# Patient Record
Sex: Male | Born: 1955 | Race: White | Hispanic: No | Marital: Married | Smoking: Current some day smoker
Health system: Southern US, Community
[De-identification: ages and names within clinical notes are randomized; demographics above are authoritative.]

## PROBLEM LIST (undated history)

## (undated) DIAGNOSIS — Z8619 Personal history of other infectious and parasitic diseases: Secondary | ICD-10-CM

## (undated) DIAGNOSIS — M1990 Unspecified osteoarthritis, unspecified site: Secondary | ICD-10-CM

## (undated) DIAGNOSIS — I10 Essential (primary) hypertension: Secondary | ICD-10-CM

## (undated) DIAGNOSIS — N400 Enlarged prostate without lower urinary tract symptoms: Secondary | ICD-10-CM

## (undated) HISTORY — DX: Personal history of other infectious and parasitic diseases: Z86.19

## (undated) HISTORY — PX: POLYPECTOMY: SHX149

## (undated) HISTORY — DX: Essential (primary) hypertension: I10

## (undated) HISTORY — PX: OTHER SURGICAL HISTORY: SHX169

## (undated) HISTORY — PX: COLONOSCOPY: SHX174

## (undated) HISTORY — DX: Unspecified osteoarthritis, unspecified site: M19.90

## (undated) HISTORY — DX: Benign prostatic hyperplasia without lower urinary tract symptoms: N40.0

---

## 2012-10-12 ENCOUNTER — Ambulatory Visit (INDEPENDENT_AMBULATORY_CARE_PROVIDER_SITE_OTHER): Payer: Managed Care, Other (non HMO) | Admitting: Family

## 2012-10-12 ENCOUNTER — Encounter: Payer: Self-pay | Admitting: Gastroenterology

## 2012-10-12 ENCOUNTER — Encounter: Payer: Self-pay | Admitting: Family

## 2012-10-12 VITALS — BP 140/90 | HR 60 | Temp 98.4°F | Resp 16 | Ht 79.5 in | Wt 204.1 lb

## 2012-10-12 DIAGNOSIS — Z136 Encounter for screening for cardiovascular disorders: Secondary | ICD-10-CM

## 2012-10-12 DIAGNOSIS — R195 Other fecal abnormalities: Secondary | ICD-10-CM

## 2012-10-12 DIAGNOSIS — Z0000 Encounter for general adult medical examination without abnormal findings: Secondary | ICD-10-CM

## 2012-10-12 DIAGNOSIS — B1920 Unspecified viral hepatitis C without hepatic coma: Secondary | ICD-10-CM

## 2012-10-12 DIAGNOSIS — Z8619 Personal history of other infectious and parasitic diseases: Secondary | ICD-10-CM | POA: Insufficient documentation

## 2012-10-12 DIAGNOSIS — N400 Enlarged prostate without lower urinary tract symptoms: Secondary | ICD-10-CM

## 2012-10-12 DIAGNOSIS — R350 Frequency of micturition: Secondary | ICD-10-CM

## 2012-10-12 DIAGNOSIS — J0100 Acute maxillary sinusitis, unspecified: Secondary | ICD-10-CM

## 2012-10-12 DIAGNOSIS — Z23 Encounter for immunization: Secondary | ICD-10-CM

## 2012-10-12 DIAGNOSIS — Z86010 Personal history of colonic polyps: Secondary | ICD-10-CM | POA: Insufficient documentation

## 2012-10-12 HISTORY — DX: Benign prostatic hyperplasia without lower urinary tract symptoms: N40.0

## 2012-10-12 LAB — BASIC METABOLIC PANEL WITH GFR
Calcium: 9 mg/dL (ref 8.4–10.5)
Creat: 0.83 mg/dL (ref 0.50–1.35)
GFR, Est African American: >89 mL/min
GFR, Est Non African American: >89 mL/min

## 2012-10-12 LAB — CBC WITH DIFFERENTIAL/PLATELET
Basophils Absolute: 0 10*3/uL (ref 0.0–0.1)
Basophils Relative: 1 % (ref 0–1)
Eosinophils Absolute: 0.1 10*3/uL (ref 0.0–0.7)
Eosinophils Relative: 1 % (ref 0–5)
Lymphocytes Relative: 21 % (ref 12–46)
MCHC: 34.9 g/dL (ref 30.0–36.0)
MCV: 99.4 fL (ref 78.0–100.0)
Platelets: 177 10*3/uL (ref 150–400)
RDW: 12.9 % (ref 11.5–15.5)
WBC: 7.9 10*3/uL (ref 4.0–10.5)

## 2012-10-12 LAB — HEPATIC FUNCTION PANEL
Albumin: 4.7 g/dL (ref 3.5–5.2)
Total Bilirubin: 0.6 mg/dL (ref 0.3–1.2)

## 2012-10-12 LAB — LIPID PANEL
Cholesterol: 174 mg/dL (ref 0–200)
HDL: 35 mg/dL — ABNORMAL LOW (ref >39)
Total CHOL/HDL Ratio: 5 Ratio

## 2012-10-12 MED ORDER — AMOXICILLIN-POT CLAVULANATE 875-125 MG PO TABS: 1.0000 | ORAL_TABLET | Freq: Two times a day (BID) | ORAL | Status: DC

## 2012-10-12 NOTE — Addendum Note (Signed)
Addended by: Mervin Kung A on: 10/12/2012 11:47 AM   Modules accepted: Orders

## 2012-10-12 NOTE — Assessment & Plan Note (Signed)
Will rx with Augmentin.

## 2012-10-12 NOTE — Progress Notes (Signed)
Subjective:    Patient ID: William Neal, male    DOB: [birth year], 57 y.o.   MRN: [id number]  HPI   Nasal congestion- on and off all winter. Worse in the afternoon, sinuses swell up.  Nasal discharge is yellow in the mornings and is associated with maxillary sinus pressure.  He denies associated fever.  Hepatitis C- diagnosed in 1990's and was treated.  Reports that this was "cleared."  Tobacco abuse-  Not motivated to quit.    Patient presents today for complete physical.  Immunizations: due for tetanus, declines flu shot Diet: reports diet is "meat and potatos" raw veggies. 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  Colonoscopy: never had one  Review of Systems  Constitutional: Negative for unexpected weight change.  HENT: Negative for hearing loss.   Eyes: Negative for visual disturbance.  Respiratory: Negative for shortness of breath.   Cardiovascular: Negative for chest pain.  Gastrointestinal: Negative for vomiting and diarrhea.       Diarrhea with onions  Genitourinary: Positive for frequency. Negative for dysuria.       Nocturia 1-2x at night.  Musculoskeletal: Negative for myalgias and arthralgias.  Skin: Negative for rash.  Neurological:       Reports + sinus HA  Hematological: Negative for adenopathy.  Psychiatric/Behavioral:       Denies depression/anxiety   Past Medical History  Diagnosis Date  . History of hepatitis C     History   Social History  . Marital Status: Married    Spouse Name: N/A    Number of Children: N/A  . Years of Education: N/A   Occupational History  . Not on file.   Social History Main Topics  . Smoking status: Current Every Day Smoker -- 1.00 packs/day for 35 years    Types: Cigarettes  . Smokeless tobacco: Never Used  . Alcohol Use: 12.6 oz/week    21 Shots of liquor per week  . Drug Use: Not on file  . Sexually Active: Not on file   Other Topics Concern  . 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, recycle program   Married   2 step children   Enjoys fishing   Completed college    History reviewed. No pertinent past surgical history.  Family History  Problem Relation Age of Onset  . Diabetes Mother   . Emphysema Father   . Hypertension Maternal Uncle     No Known Allergies  No current outpatient prescriptions on file prior to visit.   No current facility-administered medications on file prior to visit.    BP 140/90  Pulse 60  Temp(Src) 98.4 F (36.9 C) (Oral)  Resp 16  Ht 6' 7.5" (2.019 m)  Wt 204 lb 1.3 oz (92.57 kg)  BMI 22.71 kg/m2  SpO2 97%       Objective:   Physical Exam Physical Exam  Constitutional: He is oriented to person, place, and time. He appears well-developed and well-nourished. No distress.  HENT:  Head: Normocephalic and atraumatic.  Right Ear: Tympanic membrane and ear canal normal.  Left Ear: Tympanic membrane and ear canal normal.  Mouth/Throat: Oropharynx is clear and moist.  Eyes: Pupils are equal, round, and reactive to light. No scleral icterus.  Neck: Normal range of motion. No thyromegaly present.  Cardiovascular: Normal rate and regular rhythm.   No  murmur heard. Pulmonary/Chest: Effort normal and breath sounds normal. No respiratory distress. He has no wheezes. He has no rales. He exhibits no tenderness.  Abdominal: Soft. Bowel sounds are normal. He exhibits no distension and no mass. There is no tenderness. There is no rebound and no guarding.  Musculoskeletal: He exhibits no edema.  Lymphadenopathy:    He has no cervical adenopathy.  Neurological: He is alert and oriented to person, place, and time. He has normal reflexes. He exhibits normal muscle tone. Coordination normal.  Skin: Skin is warm and dry.  Psychiatric: He has a normal mood and affect. His behavior is normal. Judgment and thought content normal.  GU: Prostate is 1+  enlarged, smooth without nodules.  Heme+ stool         Assessment & Plan:          Assessment & Plan:

## 2012-10-12 NOTE — Assessment & Plan Note (Signed)
Pt counseled on healthy diet, exercise. Recommended that he limit his ETOH to <2 drinks a day. Refer for colo, obtain fasting lab work.  Pt counseled on smoking cessation, Tdap today.

## 2012-10-12 NOTE — Assessment & Plan Note (Signed)
S/p treatment. Will obtain lft and viral load.

## 2012-10-12 NOTE — Patient Instructions (Addendum)
Please complete your lab work prior to leaving.  You will be contacted about your referral for colonoscopy.  Please let us know if you have not heard back within 1 week about your referral. Please work hard on quitting smoking. Schedule mole removal at the front desk. Welcome to [facility]!

## 2012-10-12 NOTE — Assessment & Plan Note (Signed)
Will check urine to rule out infection or glucosuria.  If normal, consider trial of flomax.

## 2012-10-12 NOTE — Assessment & Plan Note (Signed)
Pt noted to have heme + stool today on exam.  Never had colo. Will refer to GI for further evaluation.

## 2012-10-13 LAB — URINALYSIS, ROUTINE W REFLEX MICROSCOPIC
Glucose, UA: NEGATIVE mg/dL
Hgb urine dipstick: NEGATIVE
Ketones, ur: NEGATIVE mg/dL
Leukocytes, UA: NEGATIVE
Specific Gravity, Urine: 1.011 (ref 1.005–1.030)
pH: 6 (ref 5.0–8.0)

## 2012-10-15 ENCOUNTER — Encounter: Payer: Self-pay | Admitting: Family

## 2012-10-15 LAB — HEPATITIS C RNA QUANTITATIVE: HCV Quantitative: NOT DETECTED IU/mL (ref <15)

## 2012-11-02 ENCOUNTER — Ambulatory Visit (AMBULATORY_SURGERY_CENTER): Payer: 59 | Admitting: *Deleted

## 2012-11-02 ENCOUNTER — Ambulatory Visit: Payer: Managed Care, Other (non HMO) | Admitting: Family

## 2012-11-02 VITALS — Ht 71.0 in | Wt 208.8 lb

## 2012-11-02 DIAGNOSIS — K921 Melena: Secondary | ICD-10-CM

## 2012-11-02 MED ORDER — NA SULFATE-K SULFATE-MG SULF 17.5-3.13-1.6 GM/177ML PO SOLN: ORAL | Status: DC

## 2012-11-09 ENCOUNTER — Encounter: Payer: Self-pay | Admitting: Gastroenterology

## 2012-11-15 ENCOUNTER — Ambulatory Visit (AMBULATORY_SURGERY_CENTER): Payer: Managed Care, Other (non HMO) | Admitting: Gastroenterology

## 2012-11-15 ENCOUNTER — Encounter: Payer: Self-pay | Admitting: Gastroenterology

## 2012-11-15 VITALS — BP 112/67 | HR 47 | Temp 98.2°F | Resp 17 | Ht 71.0 in | Wt 208.0 lb

## 2012-11-15 DIAGNOSIS — D126 Benign neoplasm of colon, unspecified: Secondary | ICD-10-CM

## 2012-11-15 DIAGNOSIS — K921 Melena: Secondary | ICD-10-CM

## 2012-11-15 DIAGNOSIS — Z1211 Encounter for screening for malignant neoplasm of colon: Secondary | ICD-10-CM

## 2012-11-15 MED ORDER — SODIUM CHLORIDE 0.9 % IV SOLN: 500.0000 mL | INTRAVENOUS | Status: DC

## 2012-11-15 NOTE — Patient Instructions (Addendum)
One of your biggest health concerns is your smoking.  This increases your risk for most cancers and serious cardiovascular diseases such as strokes, heart attacks.  You should try your best to stop.  If you need assistance, please contact your PCP or Smoking Cessation Class at [facility] [phone]) or [facility] Quit-Line (1-800-QUIT-NOW). Discharge instructions given with verbal understanding. Handout on polyps given. Resume previous medications. YOU HAD AN ENDOSCOPIC PROCEDURE TODAY AT THE [facility]: Refer to the procedure report that was given to you for any specific questions about what was found during the examination.  If the procedure report does not answer your questions, please call your gastroenterologist to clarify.  If you requested that your care partner not be given the details of your procedure findings, then the procedure report has been included in a sealed envelope for you to review at your convenience later.  YOU SHOULD EXPECT: Some feelings of bloating in the abdomen. Passage of more gas than usual.  Walking can help get rid of the air that was put into your GI tract during the procedure and reduce the bloating. If you had a lower endoscopy (such as a colonoscopy or flexible sigmoidoscopy) you may notice spotting of blood in your stool or on the toilet paper. If you underwent a bowel prep for your procedure, then you may not have a normal bowel movement for a few days.  DIET: Your first meal following the procedure should be a light meal and then it is ok to progress to your normal diet.  A half-sandwich or bowl of soup is an example of a good first meal.  Heavy or fried foods are harder to digest and may make you feel nauseous or bloated.  Likewise meals heavy in dairy and vegetables can cause extra gas to form and this can also increase the bloating.  Drink plenty of fluids but you should avoid alcoholic beverages for 24 hours.  ACTIVITY: Your care partner  should take you home directly after the procedure.  You should plan to take it easy, moving slowly for the rest of the day.  You can resume normal activity the day after the procedure however you should NOT DRIVE or use heavy machinery for 24 hours (because of the sedation medicines used during the test).    SYMPTOMS TO REPORT IMMEDIATELY: A gastroenterologist can be reached at any hour.  During normal business hours, 8:30 AM to 5:00 PM Monday through Friday, call [phone].  After hours and on weekends, please call the GI answering service at [phone] who will take a message and have the physician on call contact you.   Following lower endoscopy (colonoscopy or flexible sigmoidoscopy):  Excessive amounts of blood in the stool  Significant tenderness or worsening of abdominal pains  Swelling of the abdomen that is new, acute  Fever of 100F or higher  FOLLOW UP: If any biopsies were taken you will be contacted by phone or by letter within the next 1-3 weeks.  Call your gastroenterologist if you have not heard about the biopsies in 3 weeks.  Our staff will call the home number listed on your records the next business day following your procedure to check on you and address any questions or concerns that you may have at that time regarding the information given to you following your procedure. This is a courtesy call and so if there is no answer at the home number and we have not heard from you through the  emergency physician on call, we will assume that you have returned to your regular daily activities without incident.  SIGNATURES/CONFIDENTIALITY: You and/or your care partner have signed paperwork which will be entered into your electronic medical record.  These signatures attest to the fact that that the information above on your After Visit Summary has been reviewed and is understood.  Full responsibility of the confidentiality of this discharge information lies with you and/or your  care-partner.

## 2012-11-15 NOTE — Progress Notes (Signed)
Patient did not experience any of the following events: a burn prior to discharge; a fall within the facility; wrong site/side/patient/procedure/implant event; or a hospital transfer or hospital admission upon discharge from the facility. (G8907) Patient did not have preoperative order for IV antibiotic SSI prophylaxis. (G8918)  

## 2012-11-15 NOTE — Op Note (Addendum)
 [facility] [address] [place], [address]   COLONOSCOPY PROCEDURE REPORT  PATIENT: William, Neal  MR#: [id number] BIRTHDATE: 02/27/1956 , 56  yrs. old GENDER: Male ENDOSCOPIST: Rachael Fee, MD REFERRED GN:[id number] Peggyann Juba, FNP PROCEDURE DATE:  11/15/2012 PROCEDURE:   Submucosal injection, any substance and Colonoscopy with snare polypectomy ASA CLASS:   Class II INDICATIONS:average risk screening. MEDICATIONS: Fentanyl 75 mcg IV, Versed 8 mg IV, and These medications were titrated to patient response per physician's verbal order  DESCRIPTION OF PROCEDURE:   After the risks benefits and alternatives of the procedure were thoroughly explained, informed consent was obtained.  A digital rectal exam revealed no abnormalities of the rectum.   The LB [id number]-[id number]  endoscope was introduced through the anus and advanced to the cecum, which was identified by both the appendix and ileocecal valve. No adverse events experienced.   The quality of the prep was good.  The instrument was then slowly withdrawn as the colon was fully examined.  COLON FINDINGS: Three polyps were found, removed, and all were sent to pathology.  One was at splenic flexure, sessile, heaped up and a bit friable.  This was 2cm across and it was removed in piecemeal fashion with snare/cautery (jar 1).  The site was then labeled with [place] Ink.  One polyp was 5mm across, sessile, descending segment, removed with cold snare (jar 2).  The last was pedunculated, 1.9cm across, removed with snare, cautery, sent to pathology (jar 3). The examination was otherwise normal.  Retroflexed views revealed no abnormalities. The time to cecum=1 minutes 28 seconds. Withdrawal time=21 minutes 02 seconds.  The scope was withdrawn and the procedure completed. COMPLICATIONS: There were no complications.  ENDOSCOPIC IMPRESSION: Three polyps were found, removed, and all were sent to pathology. Two were  >1cm. The examination was otherwise normal.  RECOMMENDATIONS: If the polyp(s) removed today are proven to be adenomatous (pre-cancerous) polyps, you will need a colonoscopy in 6 months given the piecemeal resection.  Otherwise you should continue to follow colorectal cancer screening guidelines for "routine risk" patients with a colonoscopy in 10 years.  You will receive a letter within 1-2 weeks with the results of your biopsy as well as final recommendations.  Please call my office if you have not received a letter after 3 weeks.   eSigned:  Rachael Fee, MD 11/15/2012 12:15 PM Revised: 11/15/2012 12:15 PM    PATIENT NAME:  William, Neal MR#: [id number]

## 2012-11-16 ENCOUNTER — Encounter: Payer: Self-pay | Admitting: Family

## 2012-11-16 ENCOUNTER — Ambulatory Visit (INDEPENDENT_AMBULATORY_CARE_PROVIDER_SITE_OTHER): Payer: Managed Care, Other (non HMO) | Admitting: Family

## 2012-11-16 ENCOUNTER — Telehealth: Payer: Self-pay | Admitting: *Deleted

## 2012-11-16 VITALS — BP 122/90 | HR 62 | Temp 98.0°F | Resp 16 | Ht 71.0 in | Wt 209.0 lb

## 2012-11-16 DIAGNOSIS — L989 Disorder of the skin and subcutaneous tissue, unspecified: Secondary | ICD-10-CM

## 2012-11-16 NOTE — Progress Notes (Signed)
  Subjective:    Patient ID: William Neal, male    DOB: [birth year], 57 y.o.   MRN: [id number]  HPI  William Neal is a 57 yr old male who presents today requesting removal of nevus from back and removal of skin tags from neck.    Review of Systems See HPI      Objective:   Physical Exam  Constitutional: He appears well-developed and well-nourished. No distress.  Cardiovascular: Normal rate.   Skin:  Multiple skin tags noted along neck line.   Keratosis noted lower abdomen Small, flat dry lesion noted on right side of scalp.          Assessment & Plan:

## 2012-11-16 NOTE — Telephone Encounter (Signed)
Left message

## 2012-11-16 NOTE — Patient Instructions (Addendum)
Please keep area clean and dry for 24 hours. Call you develop redness/drainage from the treatment areas.

## 2012-11-16 NOTE — Addendum Note (Signed)
Addended by: Mervin Kung A on: 11/16/2012 04:00 PM   Modules accepted: Orders

## 2012-11-16 NOTE — Assessment & Plan Note (Signed)
Procedure including risks/benefits explained to patient.  Questions were answered. After informed consent was obtained skin lesion on the right scalp was cleansed with betadine and then alcohol. 1% Lidocaine with epinephrine was injected under lesion and then shave biopsy was performed. Area was cauterized to obtain hemostasis.  Pt tolerated procedure well.  Specimen sent for pathology review.  Pt instructed to keep the area dry for 24 hours and to contact us if he develops redness, drainage or swelling at the site.  Pt may use tylenol as needed for discomfort today.   Also, multiple neck skin tags were frozen using liquid nitrogen.

## 2012-11-19 ENCOUNTER — Encounter: Payer: Self-pay | Admitting: Gastroenterology

## 2012-11-19 ENCOUNTER — Encounter: Payer: Self-pay | Admitting: Family

## 2013-03-18 ENCOUNTER — Encounter: Payer: Self-pay | Admitting: Gastroenterology

## 2014-04-12 ENCOUNTER — Other Ambulatory Visit: Payer: Self-pay | Admitting: Family

## 2014-04-12 ENCOUNTER — Encounter: Payer: Self-pay | Admitting: Family

## 2014-04-12 ENCOUNTER — Ambulatory Visit (INDEPENDENT_AMBULATORY_CARE_PROVIDER_SITE_OTHER): Payer: Managed Care, Other (non HMO) | Admitting: Family

## 2014-04-12 VITALS — BP 136/88 | HR 62 | Temp 98.8°F | Resp 16 | Ht 71.0 in | Wt 202.1 lb

## 2014-04-12 DIAGNOSIS — R509 Fever, unspecified: Secondary | ICD-10-CM

## 2014-04-12 LAB — CBC WITH DIFFERENTIAL/PLATELET
BASOS ABS: 0 10*3/uL (ref 0.0–0.1)
Basophils Relative: 0 % (ref 0–1)
EOS ABS: 0 10*3/uL (ref 0.0–0.7)
Eosinophils Relative: 0 % (ref 0–5)
HCT: 46.4 % (ref 39.0–52.0)
Hemoglobin: 16.2 g/dL (ref 13.0–17.0)
Lymphocytes Relative: 12 % (ref 12–46)
Lymphs Abs: 1.4 10*3/uL (ref 0.7–4.0)
MCH: 33.4 pg (ref 26.0–34.0)
MCHC: 34.9 g/dL (ref 30.0–36.0)
MCV: 95.7 fL (ref 78.0–100.0)
Monocytes Absolute: 1.5 10*3/uL — ABNORMAL HIGH (ref 0.1–1.0)
Monocytes Relative: 13 % — ABNORMAL HIGH (ref 3–12)
NEUTROS ABS: 8.8 10*3/uL — AB (ref 1.7–7.7)
NEUTROS PCT: 75 % (ref 43–77)
Platelets: 189 10*3/uL (ref 150–400)
RBC: 4.85 MIL/uL (ref 4.22–5.81)
RDW: 13.7 % (ref 11.5–15.5)
WBC: 11.7 10*3/uL — ABNORMAL HIGH (ref 4.0–10.5)

## 2014-04-12 LAB — HEPATIC FUNCTION PANEL
ALBUMIN: 4.4 g/dL (ref 3.5–5.2)
ALT: 14 U/L (ref 0–53)
AST: 18 U/L (ref 0–37)
Alkaline Phosphatase: 80 U/L (ref 39–117)
BILIRUBIN DIRECT: 0.2 mg/dL (ref 0.0–0.3)
Indirect Bilirubin: 0.6 mg/dL (ref 0.2–1.2)
Total Bilirubin: 0.8 mg/dL (ref 0.2–1.2)
Total Protein: 6.9 g/dL (ref 6.0–8.3)

## 2014-04-12 MED ORDER — DOXYCYCLINE HYCLATE 100 MG PO TABS: 100.0000 mg | ORAL_TABLET | Freq: Two times a day (BID) | ORAL | Status: DC

## 2014-04-12 NOTE — Progress Notes (Signed)
Pre visit review using our clinic review tool, if applicable. No additional management support is needed unless otherwise documented below in the visit note. 

## 2014-04-12 NOTE — Assessment & Plan Note (Signed)
Cause of fever is most likely viral or possibly due to lyme disease.  He does report tick bite 10 days ago.  Will obtain baseline labs, RMSF titer, lyme testing, start empiric doxy.  HA is concerning, however he does not have nuchal rigidity so my suspiscion for meningitis is low.  His visual complaints are consistent with his report of previous optical migraines. He denies current blurring at time of exam.  I did advise pt that should he develop visual changes which do not improve in a few minutes, he should proceed to the ED and he verbalizes understanding.

## 2014-04-12 NOTE — Patient Instructions (Addendum)
Please complete lab work prior to leaving. We will contact you with your results. Start doxycycline for possible lyme disease. Follow up on 04/24/14.  Call sooner if symptoms worsen, or if you are not feeling better in 2-3 days.

## 2014-04-12 NOTE — Progress Notes (Signed)
Subjective:    Patient ID: William Neal, male    DOB: 1956-07-25, 58 y.o.   MRN: [id number]  HPI  William Neal is a 58 yr old male who presents today.  1) Fever- Monday after lunch developed aches/chills.  Tmax 101.3 that night.  Took advil went to bed.  Has had HA "this whole time." Last night took temp 101.8, took advil, woke up "in a puddle of sweat."  Mild nausea without vomiting.  + diarrhea x 3.  Denies neck pain/stiffness.  Mild photophobia.  Reports hx of migraines.  Reports HA is 6/10.  Reports wife had vomiting 1 week ago and this resolved.  Denies recent travel. Reports + tick bite on his back 10 days ago.    2) Blurred vision- reports episode of blurred vision in the left eye x 2 days. Reports that Monday and today he has had some intermittent blurring in the left eye.  In the past he has had vision changes with migraines.  No blurring right now.      Review of Systems  Respiratory:       Mild cough, feels like it is "harder to get air in and out."   Gastrointestinal: Negative for abdominal pain.  Genitourinary: Positive for frequency. Negative for dysuria.  Hematological: Negative for adenopathy.   Past Medical History  Diagnosis Date  . History of hepatitis C     History   Social History  . Marital Status: Married    Spouse Name: N/A    Number of Children: N/A  . Years of Education: N/A   Occupational History  . Not on file.   Social History Main Topics  . Smoking status: Current Every Day Smoker -- 1.00 packs/day for 35 years    Types: Cigarettes  . Smokeless tobacco: Never Used  . Alcohol Use: 12.6 oz/week    21 Shots of liquor per week  . Drug Use: No  . Sexual Activity: Not on file   Other Topics Concern  . 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 No prior surgery      Family History  Problem Relation Age of Onset  . Diabetes Mother   . Emphysema Father   . Hypertension Maternal Uncle   . Colon cancer Neg Hx   . Esophageal cancer Neg Hx   . Rectal cancer Neg Hx   . Stomach cancer Neg Hx     No Known Allergies  No current outpatient prescriptions on file prior to visit.   No current facility-administered medications on file prior to visit.    BP 136/88  Pulse 62  Temp(Src) 98.8 F (37.1 C) (Oral)  Resp 16  Ht 5\' 11"  (1.803 m)  Wt 202 lb 1.9 oz (91.681 kg)  BMI 28.20 kg/m2  SpO2 99%       Objective:   Physical Exam  Constitutional: He is oriented to person, place, and time. He appears well-developed and well-nourished. No distress.  HENT:  Head: Normocephalic and atraumatic.  Right Ear: Tympanic membrane and ear canal normal.  Left Ear: Tympanic membrane and ear canal normal.  Mouth/Throat: No oropharyngeal exudate, posterior oropharyngeal edema or posterior oropharyngeal erythema.  Eyes: Right eye exhibits no discharge.  No scleral icterus.  Fundoscopic exam:      The right eye shows no hemorrhage.       The left eye shows no hemorrhage.  Cardiovascular: Normal rate and regular rhythm.   No murmur heard. Pulmonary/Chest: Effort normal and breath sounds normal. No respiratory distress. He has no wheezes. He has no rales. He exhibits no tenderness.  Lymphadenopathy:    He has no cervical adenopathy.  Neurological: He is alert and oriented to person, place, and time.  Psychiatric: He has a normal mood and affect. His behavior is normal. Judgment and thought content normal.          Assessment & Plan:

## 2014-04-13 LAB — URINALYSIS W MICROSCOPIC + REFLEX CULTURE
BACTERIA UA: NONE SEEN
BILIRUBIN URINE: NEGATIVE
CASTS: NONE SEEN
CRYSTALS: NONE SEEN
Glucose, UA: NEGATIVE mg/dL
Hgb urine dipstick: NEGATIVE
KETONES UR: NEGATIVE mg/dL
Leukocytes, UA: NEGATIVE
Nitrite: NEGATIVE
PH: 6 (ref 5.0–8.0)
Protein, ur: NEGATIVE mg/dL
SPECIFIC GRAVITY, URINE: 1.018 (ref 1.005–1.030)
Squamous Epithelial / LPF: NONE SEEN
Urobilinogen, UA: 1 mg/dL (ref 0.0–1.0)

## 2014-04-13 LAB — LYME DISEASE DNA BY PCR(BORRELIA BURG): B burgdorferi DNA: NOT DETECTED

## 2014-04-13 LAB — ROCKY MTN SPOTTED FVR ABS PNL(IGG+IGM)
RMSF IgG: 0.1 IV
RMSF IgM: 0.57 IV

## 2014-04-13 LAB — LYME AB/WESTERN BLOT REFLEX: B BURGDORFERI AB IGG+ IGM: 0.16 {ISR}

## 2014-04-14 ENCOUNTER — Telehealth: Payer: Self-pay | Admitting: *Deleted

## 2014-04-14 NOTE — Telephone Encounter (Signed)
Notified pt and he voices understanding. 

## 2014-04-14 NOTE — Telephone Encounter (Signed)
Message copied by Ronny Flurry on Fri Apr 14, 2014  8:59 AM ------      Message from: O'SULLIVAN, MELISSA      Created: Thu Apr 13, 2014  8:57 PM       Labs look good- neg for rocky mountain spotted fever, neg for lyme disease. He can stop doxycycline.  White blood cell count is mildly elevated.  I suspect that his symptoms are viral in nature.  How is he feeling?  He should contact us if symptoms worsen, if fever >101,  Or if symptoms do not continue to improve. ------

## 2014-04-14 NOTE — Telephone Encounter (Signed)
OK to continue doxycycline.

## 2014-04-14 NOTE — Telephone Encounter (Signed)
Notified pt and he voices understanding. He reports that he has felt much better since starting the antibiotic and is hesitant to stop it. He reports that he continues to have night sweats and fatigue but denies fevers > 101.  Please advise.

## 2014-04-24 ENCOUNTER — Encounter: Payer: Self-pay | Admitting: Family

## 2014-04-24 ENCOUNTER — Telehealth: Payer: Self-pay | Admitting: Family

## 2014-04-24 ENCOUNTER — Ambulatory Visit (INDEPENDENT_AMBULATORY_CARE_PROVIDER_SITE_OTHER): Payer: Managed Care, Other (non HMO) | Admitting: Family

## 2014-04-24 VITALS — BP 126/86 | HR 55 | Temp 98.1°F | Resp 16 | Ht 71.0 in | Wt 208.0 lb

## 2014-04-24 DIAGNOSIS — J329 Chronic sinusitis, unspecified: Secondary | ICD-10-CM | POA: Insufficient documentation

## 2014-04-24 DIAGNOSIS — R195 Other fecal abnormalities: Secondary | ICD-10-CM

## 2014-04-24 DIAGNOSIS — L723 Sebaceous cyst: Secondary | ICD-10-CM

## 2014-04-24 DIAGNOSIS — R5381 Other malaise: Secondary | ICD-10-CM

## 2014-04-24 DIAGNOSIS — J0110 Acute frontal sinusitis, unspecified: Secondary | ICD-10-CM

## 2014-04-24 DIAGNOSIS — R5383 Other fatigue: Principal | ICD-10-CM

## 2014-04-24 DIAGNOSIS — L729 Follicular cyst of the skin and subcutaneous tissue, unspecified: Secondary | ICD-10-CM | POA: Insufficient documentation

## 2014-04-24 LAB — CBC WITH DIFFERENTIAL/PLATELET
BASOS PCT: 1 % (ref 0–1)
Basophils Absolute: 0.1 10*3/uL (ref 0.0–0.1)
Eosinophils Absolute: 0.2 10*3/uL (ref 0.0–0.7)
Eosinophils Relative: 3 % (ref 0–5)
HEMATOCRIT: 46.3 % (ref 39.0–52.0)
Hemoglobin: 15.9 g/dL (ref 13.0–17.0)
LYMPHS PCT: 22 % (ref 12–46)
Lymphs Abs: 1.8 10*3/uL (ref 0.7–4.0)
MCH: 33.1 pg (ref 26.0–34.0)
MCHC: 34.3 g/dL (ref 30.0–36.0)
MCV: 96.3 fL (ref 78.0–100.0)
Monocytes Absolute: 1 10*3/uL (ref 0.1–1.0)
Monocytes Relative: 12 % (ref 3–12)
NEUTROS ABS: 5 10*3/uL (ref 1.7–7.7)
NEUTROS PCT: 62 % (ref 43–77)
PLATELETS: 259 10*3/uL (ref 150–400)
RBC: 4.81 MIL/uL (ref 4.22–5.81)
RDW: 13.8 % (ref 11.5–15.5)
WBC: 8 10*3/uL (ref 4.0–10.5)

## 2014-04-24 LAB — TSH: TSH: 2.673 u[IU]/mL (ref 0.350–4.500)

## 2014-04-24 MED ORDER — CEFUROXIME AXETIL 500 MG PO TABS: 500.0000 mg | ORAL_TABLET | Freq: Two times a day (BID) | ORAL | Status: DC

## 2014-04-24 NOTE — Progress Notes (Signed)
Pre visit review using our clinic review tool, if applicable. No additional management support is needed unless otherwise documented below in the visit note. 

## 2014-04-24 NOTE — Progress Notes (Signed)
Subjective:    Patient ID: William Neal, male    DOB: [birth year], 58 y.o.   MRN: [id number]  HPI  William Neal is a 58 yr old male who presents today for follow up. He was initially seen on 04/12/14 with complaint of fever and fatigue.  He was treated empirically with doxycycline- reports that he partially completed the rx.  RMSF/Lyme testing was negative. WBC was noted to be mildly elevated at 11.7.    Today he reports poor energy, ongoing sinus congestion,  + frontal sinus headache, not as severe as before. Nasal drainage is thick and white. Denies associated fever.   Reports vision has returned to normal.  Declines referral for eye exam at this time.   Skin cyst-  Left cheek.   Reports that he first noticed at age 28, since that time it will flare up from time to time and drain. Reports it is currently "flared up."   Review of Systems See HPI  Past Medical History  Diagnosis Date  . History of hepatitis C     History   Social History  . Marital Status: Married    Spouse Name: N/A    Number of Children: N/A  . Years of Education: N/A   Occupational History  . Not on file.   Social History Main Topics  . Smoking status: Current Every Day Smoker -- 1.00 packs/day for 35 years    Types: Cigarettes  . Smokeless tobacco: Never Used  . Alcohol Use: 12.6 oz/week    21 Shots of liquor per week  . Drug Use: No  . Sexual Activity: Not on file   Other Topics Concern  . 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 No prior surgery      Family History  Problem Relation Age of Onset  . Diabetes Mother   . Emphysema Father   . Hypertension Maternal Uncle   . Colon cancer Neg Hx   . Esophageal cancer Neg Hx   . Rectal cancer Neg Hx   . Stomach cancer Neg Hx     No Known  Allergies  No current outpatient prescriptions on file prior to visit.   No current facility-administered medications on file prior to visit.    BP 126/86  Pulse 55  Temp(Src) 98.1 F (36.7 C) (Oral)  Resp 16  Ht 5\' 11"  (1.803 m)  Wt 208 lb 0.6 oz (94.366 kg)  BMI 29.03 kg/m2  SpO2 95%       Objective:   Physical Exam  Constitutional: He appears well-developed and well-nourished. No distress.  HENT:  Head: Normocephalic and atraumatic.  Cardiovascular: Normal rate and regular rhythm.   No murmur heard. Pulmonary/Chest: Effort normal and breath sounds normal. No respiratory distress. He has no wheezes. He has no rales. He exhibits no tenderness.  Abdominal: Soft. Bowel sounds are normal. He exhibits no distension. There is no tenderness. There is no rebound.  Neurological: He is alert.  Skin:  Small pea sized cyst noted beneath skin overlying left cheek bone.  Mild associated erythema and dermal scarring, small open sinus noted.  Psychiatric: He has a normal mood and affect. His behavior is normal. Judgment and thought content normal.  Assessment & Plan:

## 2014-04-24 NOTE — Assessment & Plan Note (Addendum)
Improving but not resolved. Could be related to sinusitis. Will check ebv panel, testosterone and tsh.  Repeat cbc to ensure wbc have returned to normal.

## 2014-04-24 NOTE — Assessment & Plan Note (Signed)
Will rx with ceftin. Add claritin and flonase.

## 2014-04-24 NOTE — Telephone Encounter (Signed)
Relevant patient education assigned to patient using Emmi. ° °

## 2014-04-24 NOTE — Assessment & Plan Note (Signed)
Will rx with ceftin and refer to dermatology, will likely need local excision due to recurrent nature.

## 2014-04-24 NOTE — Telephone Encounter (Signed)
Please contact pt and let him know that I reviewed his chart and see that Dr. Ardis Hughs (GI) wanted to see him back in 6 months follow up from his colonoscopy which was performed in 2014. He should contact his office to arrange follow up. [phone]

## 2014-04-24 NOTE — Patient Instructions (Signed)
Please complete lab work prior to leaving. Start ceftin for sinus and skin cyst on your cheek. Start claritin and flonase (both over the counter). You will be contacted about your referral to dermatology. Call if symptom worsen or if not resolved by the time you complete your antibiotics.

## 2014-04-24 NOTE — Assessment & Plan Note (Addendum)
He is due for follow up with GI, see phone note.

## 2014-04-25 LAB — TESTOSTERONE, FREE, TOTAL, SHBG
SEX HORMONE BINDING: 52 nmol/L (ref 13–71)
TESTOSTERONE FREE: 68.4 pg/mL (ref 47.0–244.0)
TESTOSTERONE: 448 ng/dL (ref 300–890)
Testosterone-% Free: 1.5 % — ABNORMAL LOW (ref 1.6–2.9)

## 2014-04-25 LAB — EPSTEIN-BARR VIRUS VCA ANTIBODY PANEL
EBV EA IGG: 35.5 U/mL — AB (ref <9.0)
EBV NA IgG: 141 U/mL — ABNORMAL HIGH (ref <18.0)
EBV VCA IgG: 305 U/mL — ABNORMAL HIGH (ref <18.0)
EBV VCA IgM: <10 U/mL (ref <36.0)

## 2014-04-25 NOTE — Telephone Encounter (Signed)
Left message for pt to return my call and let us know if we can leave him a detailed message.

## 2014-05-02 NOTE — Telephone Encounter (Signed)
Notified pt and he voices understanding.  States he will see if Dr Ardis Hughs is in network with his insurance and if not will call us back to be referred to [facility].

## 2015-05-09 ENCOUNTER — Ambulatory Visit (INDEPENDENT_AMBULATORY_CARE_PROVIDER_SITE_OTHER): Payer: PRIVATE HEALTH INSURANCE | Admitting: Family

## 2015-05-09 ENCOUNTER — Encounter: Payer: Self-pay | Admitting: Family

## 2015-05-09 VITALS — BP 128/82 | HR 71 | Temp 98.1°F | Resp 16 | Ht 71.0 in | Wt 206.4 lb

## 2015-05-09 DIAGNOSIS — Z720 Tobacco use: Secondary | ICD-10-CM

## 2015-05-09 DIAGNOSIS — J441 Chronic obstructive pulmonary disease with (acute) exacerbation: Secondary | ICD-10-CM

## 2015-05-09 DIAGNOSIS — J209 Acute bronchitis, unspecified: Secondary | ICD-10-CM

## 2015-05-09 DIAGNOSIS — J440 Chronic obstructive pulmonary disease with acute lower respiratory infection: Principal | ICD-10-CM

## 2015-05-09 MED ORDER — ALBUTEROL SULFATE HFA 108 (90 BASE) MCG/ACT IN AERS: 2.0000 | INHALATION_SPRAY | Freq: Four times a day (QID) | RESPIRATORY_TRACT | Status: DC | PRN

## 2015-05-09 MED ORDER — AZITHROMYCIN 250 MG PO TABS: ORAL_TABLET | ORAL | Status: DC

## 2015-05-09 MED ORDER — PREDNISONE 10 MG PO TABS: ORAL_TABLET | ORAL | Status: DC

## 2015-05-09 NOTE — Progress Notes (Signed)
Pre visit review using our clinic review tool, if applicable. No additional management support is needed unless otherwise documented below in the visit note. 

## 2015-05-09 NOTE — Patient Instructions (Signed)
Start zpak and prednisone taper. Also use the albuterol inhaler 2 puffs every 6 hours.  Call if symptoms worsen, if fever, or if not improved in 1 week. Work on quitting smoking.

## 2015-05-09 NOTE — Progress Notes (Signed)
   Subjective:    Patient ID: William Neal, male    DOB: Aug 16, 1956, 59 y.o.   MRN: [id number]  HPI   6 day hx of cough.  Sinus congestion initially now in his chest.  Breathing is "tight" and has some wheezing.  He denies fever. Reports overall malaise, though he continues to work. + sinus headaches.  Tried theraflu last night with little improvement.  He uses flonase and claritin some days.   Review of Systems See HPI  Past Medical History  Diagnosis Date  . History of hepatitis C     Social History   Social History  . Marital Status: Married    Spouse Name: N/A  . Number of Children: N/A  . Years of Education: N/A   Occupational History  . Not on file.   Social History Main Topics  . Smoking status: Current Every Day Smoker -- 1.00 packs/day for 35 years    Types: Cigarettes  . Smokeless tobacco: Never Used  . Alcohol Use: 12.6 oz/week    21 Shots of liquor per week  . Drug Use: No  . Sexual Activity: Not on file   Other Topics Concern  . 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 No prior surgery      Family History  Problem Relation Age of Onset  . Diabetes Mother   . Emphysema Father   . Hypertension Maternal Uncle   . Colon cancer Neg Hx   . Esophageal cancer Neg Hx   . Rectal cancer Neg Hx   . Stomach cancer Neg Hx     No Known Allergies  Current Outpatient Prescriptions on File Prior to Visit  Medication Sig Dispense Refill  . fluticasone (FLONASE) 50 MCG/ACT nasal spray Place 2 sprays into both nostrils daily.    Marland Kitchen loratadine (CLARITIN) 10 MG tablet Take 10 mg by mouth daily.     No current facility-administered medications on file prior to visit.    BP 128/82 mmHg  Pulse 71  Temp(Src) 98.1 F (36.7 C) (Oral)  Resp 16  Ht 5\' 11"  (1.803 m)   Wt 206 lb 6.4 oz (93.622 kg)  BMI 28.80 kg/m2  SpO2 94%       Objective:   Physical Exam  Constitutional: He is oriented to person, place, and time. He appears well-developed and well-nourished. No distress.  HENT:  Head: Normocephalic and atraumatic.  Right Ear: Tympanic membrane and ear canal normal.  Left Ear: Tympanic membrane and ear canal normal.  Mouth/Throat: No oropharyngeal exudate, posterior oropharyngeal edema or posterior oropharyngeal erythema.  Cardiovascular: Normal rate and regular rhythm.   No murmur heard. Pulmonary/Chest: Effort normal. No respiratory distress.  Diminished breath sounds throughout  Musculoskeletal: He exhibits no edema.  Lymphadenopathy:    He has no cervical adenopathy.  Neurological: He is alert and oriented to person, place, and time.  Skin: Skin is warm and dry.  Psychiatric: He has a normal mood and affect. His behavior is normal. Thought content normal.          Assessment & Plan:

## 2015-05-10 DIAGNOSIS — J209 Acute bronchitis, unspecified: Secondary | ICD-10-CM | POA: Insufficient documentation

## 2015-05-10 DIAGNOSIS — J440 Chronic obstructive pulmonary disease with acute lower respiratory infection: Principal | ICD-10-CM

## 2015-05-10 NOTE — Assessment & Plan Note (Signed)
rx with zpak, pred taper, albuterol mdi, counseled pt on the importance of smoking cessation.  3-5 min spent counseling pt on smoking cessation. Pt advised to call if symptoms worsen or if symptoms do not improve.

## 2016-03-10 ENCOUNTER — Encounter: Payer: Self-pay | Admitting: Family

## 2016-03-10 ENCOUNTER — Ambulatory Visit (INDEPENDENT_AMBULATORY_CARE_PROVIDER_SITE_OTHER): Payer: Managed Care, Other (non HMO) | Admitting: Family

## 2016-03-10 VITALS — BP 150/70 | HR 50 | Temp 97.8°F | Ht 71.0 in | Wt 207.8 lb

## 2016-03-10 DIAGNOSIS — F40298 Other specified phobia: Secondary | ICD-10-CM | POA: Diagnosis not present

## 2016-03-10 DIAGNOSIS — Z0001 Encounter for general adult medical examination with abnormal findings: Secondary | ICD-10-CM | POA: Diagnosis not present

## 2016-03-10 DIAGNOSIS — Z0000 Encounter for general adult medical examination without abnormal findings: Secondary | ICD-10-CM | POA: Diagnosis not present

## 2016-03-10 DIAGNOSIS — R350 Frequency of micturition: Secondary | ICD-10-CM

## 2016-03-10 LAB — URINALYSIS, ROUTINE W REFLEX MICROSCOPIC
Bilirubin Urine: NEGATIVE
Hgb urine dipstick: NEGATIVE
KETONES UR: NEGATIVE
LEUKOCYTES UA: NEGATIVE
NITRITE: NEGATIVE
PH: 6 (ref 5.0–8.0)
SPECIFIC GRAVITY, URINE: 1.02 (ref 1.000–1.030)
Total Protein, Urine: NEGATIVE
UROBILINOGEN UA: 0.2 (ref 0.0–1.0)
Urine Glucose: NEGATIVE

## 2016-03-10 LAB — TSH: TSH: 3.02 u[IU]/mL (ref 0.35–4.50)

## 2016-03-10 LAB — LIPID PANEL
CHOLESTEROL: 191 mg/dL (ref 0–200)
HDL: 30.6 mg/dL — AB (ref >39.00)
LDL Cholesterol: 122 mg/dL — ABNORMAL HIGH (ref 0–99)
NonHDL: 160.45
TRIGLYCERIDES: 193 mg/dL — AB (ref 0.0–149.0)
Total CHOL/HDL Ratio: 6
VLDL: 38.6 mg/dL (ref 0.0–40.0)

## 2016-03-10 LAB — PSA: PSA: 0.62 ng/mL (ref 0.10–4.00)

## 2016-03-10 LAB — CBC WITH DIFFERENTIAL/PLATELET
BASOS PCT: 0.5 % (ref 0.0–3.0)
Basophils Absolute: 0 10*3/uL (ref 0.0–0.1)
EOS ABS: 0.2 10*3/uL (ref 0.0–0.7)
EOS PCT: 2.1 % (ref 0.0–5.0)
HEMATOCRIT: 44.1 % (ref 39.0–52.0)
HEMOGLOBIN: 15 g/dL (ref 13.0–17.0)
LYMPHS PCT: 24.9 % (ref 12.0–46.0)
Lymphs Abs: 2.1 10*3/uL (ref 0.7–4.0)
MCHC: 34 g/dL (ref 30.0–36.0)
MCV: 94.3 fl (ref 78.0–100.0)
MONOS PCT: 9.8 % (ref 3.0–12.0)
Monocytes Absolute: 0.8 10*3/uL (ref 0.1–1.0)
Neutro Abs: 5.3 10*3/uL (ref 1.4–7.7)
Neutrophils Relative %: 62.7 % (ref 43.0–77.0)
Platelets: 219 10*3/uL (ref 150.0–400.0)
RBC: 4.68 Mil/uL (ref 4.22–5.81)
RDW: 13.7 % (ref 11.5–15.5)
WBC: 8.4 10*3/uL (ref 4.0–10.5)

## 2016-03-10 LAB — BASIC METABOLIC PANEL
BUN: 17 mg/dL (ref 6–23)
CHLORIDE: 108 meq/L (ref 96–112)
CO2: 27 mEq/L (ref 19–32)
Calcium: 9.4 mg/dL (ref 8.4–10.5)
Creatinine, Ser: 0.9 mg/dL (ref 0.40–1.50)
GFR: 92.24 mL/min (ref >60.00)
Glucose, Bld: 102 mg/dL — ABNORMAL HIGH (ref 70–99)
POTASSIUM: 4 meq/L (ref 3.5–5.1)
SODIUM: 141 meq/L (ref 135–145)

## 2016-03-10 LAB — HEPATIC FUNCTION PANEL
ALBUMIN: 4.4 g/dL (ref 3.5–5.2)
ALT: 18 U/L (ref 0–53)
AST: 19 U/L (ref 0–37)
Alkaline Phosphatase: 91 U/L (ref 39–117)
Bilirubin, Direct: 0.1 mg/dL (ref 0.0–0.3)
Total Bilirubin: 0.4 mg/dL (ref 0.2–1.2)
Total Protein: 6.5 g/dL (ref 6.0–8.3)

## 2016-03-10 MED ORDER — TAMSULOSIN HCL 0.4 MG PO CAPS: 0.4000 mg | ORAL_CAPSULE | Freq: Every day | ORAL | Status: DC

## 2016-03-10 MED ORDER — LORAZEPAM 1 MG PO TABS: ORAL_TABLET | ORAL | Status: DC

## 2016-03-10 MED FILL — LORazepam 1 MG TABS: 1 | 3 days supply | Qty: 3 | Fill #0

## 2016-03-10 MED FILL — TAMSULOSIN HCL 0.4 MG CAP: 0.4 | 30 days supply | Qty: 30 | Fill #0

## 2016-03-10 NOTE — Progress Notes (Signed)
Pre visit review using our clinic review tool, if applicable. No additional management support is needed unless otherwise documented below in the visit note. 

## 2016-03-10 NOTE — Addendum Note (Signed)
Addended by: Debbrah Alar on: 03/10/2016 11:31 AM   Modules accepted: Miquel Dunn

## 2016-03-10 NOTE — Patient Instructions (Addendum)
You will be contacted about your appointment for colonoscopy.  Schedule follow up with your dentist. You may use 1 tablet of ativan prior to dental procedure for anxiety (have someone drive you). Schedule a routine eye exam. Let me know when you are ready to quit smoking. Try to get 30 minutes of cardio 5 days a week. Try to eat more fresh fruits/veggies. Begin flomax for your urinary frequency.

## 2016-03-10 NOTE — Progress Notes (Addendum)
Subjective:    Patient ID: William Neal, male    DOB: 01-26-1956, 60 y.o.   MRN: [id number]  HPI   Mr. William Neal is a 60 yr old male who presents today for a complete physical.  Patient presents today for complete physical.  Immunizations: tetanus is up to date Diet: reports that he eats "what I want do."  Too much junk Exercise: active at work Colonoscopy: had colo on 3/14- adenomatous, was recommended to have a 6 month follow up.  Patient did not schedule.  Dental: overdue, has panic attacks prior to dental visits.   Vision: due Tobacco abuse: ongoing not motivated to quit.   Alcohol use:  Quit drinking last september    Review of Systems  Constitutional: Negative for unexpected weight change.  HENT:       Chronic sinus drainage  Eyes:       "needs glasses" used to use bifocals  Respiratory: Negative for cough.   Cardiovascular: Negative for leg swelling.  Gastrointestinal: Negative for diarrhea, constipation and blood in stool.  Genitourinary: Negative for dysuria and hematuria.       Occasional urgency.  Does not get up at night.   Musculoskeletal: Negative for myalgias and arthralgias.       + pain into the left shoulder "from my back" inversion table seems to help  Skin: Negative for rash.  Neurological:       + HA's, coming in and out of hot/cold  Hematological: Negative for adenopathy.  Psychiatric/Behavioral:       Reports bout of depression 1 month ago.     Past Medical History  Diagnosis Date  . History of hepatitis C      Social History   Social History  . Marital Status: Married    Spouse Name: N/A  . Number of Children: N/A  . Years of Education: N/A   Occupational History  . Not on file.   Social History Main Topics  . Smoking status: Current Every Day Smoker -- 1.00 packs/day for 35 years    Types: Cigarettes  . Smokeless tobacco: Never Used  . Alcohol Use: 12.6 oz/week    21 Shots of liquor per week  . Drug Use: No  . Sexual Activity:  Not on file   Other Topics Concern  . Not on file   Social History Narrative   Reports 3 drinks a day   Married to William Neal, recycle program   Married   2 step children   Enjoys fishing   Completed college    Past Surgical History  Procedure Laterality Date  . No prior surgery      Family History  Problem Relation Age of Onset  . Diabetes Mother   . Emphysema Father   . Hypertension Maternal Uncle   . Colon cancer Neg Hx   . Esophageal cancer Neg Hx   . Rectal cancer Neg Hx   . Stomach cancer Neg Hx     No Known Allergies  Current Outpatient Prescriptions on File Prior to Visit  Medication Sig Dispense Refill  . fluticasone (FLONASE) 50 MCG/ACT nasal spray Place 2 sprays into both nostrils daily. Reported on 03/10/2016    . loratadine (CLARITIN) 10 MG tablet Take 10 mg by mouth daily. Reported on 03/10/2016     No current facility-administered medications on file prior to visit.    BP 150/70 mmHg  Pulse 50  Temp(Src) 97.8 F (36.6 C) (Oral)  Ht 5\' 11"  (  1.803 m)  Wt 207 lb 12.8 oz (94.257 kg)  BMI 28.99 kg/m2  SpO2 98%       Objective:   Physical Exam Physical Exam  Constitutional: He is oriented to person, place, and time. He appears well-developed and well-nourished. No distress.  HENT:  Head: Normocephalic and atraumatic.  Right Ear: Tympanic membrane and ear canal normal.  Left Ear: Tympanic membrane and ear canal normal.  Mouth/Throat: Oropharynx is clear and moist.  Eyes: Pupils are equal, round, and reactive to light. No scleral icterus.  Neck: Normal range of motion. No thyromegaly present.  Cardiovascular: Normal rate and regular rhythm.   No murmur heard. Pulmonary/Chest: Effort normal and breath sounds normal. No respiratory distress. He has no wheezes. He has no rales. He exhibits no tenderness.  Abdominal: Soft. Bowel sounds are normal. He exhibits no distension and no mass. There is no tenderness. There is no rebound and no  guarding.  Musculoskeletal: He exhibits no edema.  Lymphadenopathy:    He has no cervical adenopathy.  Neurological: He is alert and oriented to person, place, and time. He has normal patellar reflexes. He exhibits normal muscle tone. Coordination normal.  Skin: Skin is warm and dry.  Psychiatric: He has a normal mood and affect. His behavior is normal. Judgment and thought content normal.  GU: no significant prostatic enlargement, heme negative         Assessment & Plan:          Assessment & Plan:  Preventative care- discussed diet, exercise, weight loss.  EKG tracing is personally reviewed.  EKG notes NSR.  No acute changes. BP recheck today was 138/82, plan to repeat bp in 3 months. Refer for colo.    Urinary frequency-  ?mild BPH, obtain UA and PSA, trial of flomax.   Phobia- (dentistry) has a tooth that is hurting but has put off dental visit due to "panic attacks" when he attends the dentist. Rx given for ativan 1mg  prior to dental visit- he understands that someone needs to drive him to this visit.

## 2016-03-10 NOTE — Addendum Note (Signed)
Addended by: Harl Bowie on: 03/10/2016 12:06 PM   Modules accepted: Miquel Dunn

## 2016-04-03 MED FILL — AMOXICILLIN 500 MG CAPSULE: 500 | 10 days supply | Qty: 40 | Fill #0

## 2016-04-03 MED FILL — traMADol HCL 50 MG TABS: 50 | 5 days supply | Qty: 20 | Fill #0

## 2016-04-07 MED FILL — TAMSULOSIN HCL 0.4 MG CAP: 0.4 | 30 days supply | Qty: 30 | Fill #1

## 2016-05-06 ENCOUNTER — Encounter (HOSPITAL_BASED_OUTPATIENT_CLINIC_OR_DEPARTMENT_OTHER): Payer: Self-pay | Admitting: *Deleted

## 2016-05-06 ENCOUNTER — Emergency Department (HOSPITAL_BASED_OUTPATIENT_CLINIC_OR_DEPARTMENT_OTHER)
Admission: EM | Admit: 2016-05-06 | Discharge: 2016-05-06 | Disposition: A | Discharge status: Home / Self Care | Payer: Managed Care, Other (non HMO) | Attending: Emergency Medicine | Admitting: Emergency Medicine

## 2016-05-06 DIAGNOSIS — F17210 Nicotine dependence, cigarettes, uncomplicated: Secondary | ICD-10-CM | POA: Insufficient documentation

## 2016-05-06 DIAGNOSIS — T63441A Toxic effect of venom of bees, accidental (unintentional), initial encounter: Secondary | ICD-10-CM | POA: Diagnosis not present

## 2016-05-06 DIAGNOSIS — Z79899 Other long term (current) drug therapy: Secondary | ICD-10-CM | POA: Insufficient documentation

## 2016-05-06 MED ORDER — PREDNISONE 20 MG PO TABS: 40.0000 mg | ORAL_TABLET | Freq: Every day | ORAL | 0 refills | Status: AC

## 2016-05-06 MED ORDER — PREDNISONE 50 MG PO TABS
60.0000 mg | ORAL_TABLET | Freq: Once | ORAL | Status: AC
  Administered 2016-05-06: 60 mg via ORAL
  Filled 2016-05-06: qty 1

## 2016-05-06 MED FILL — predniSONE 20 MG TABS: 20 | 4 days supply | Qty: 8 | Fill #0

## 2016-05-06 NOTE — ED Provider Notes (Signed)
[place] [facility] Provider Note   CSN: [id number] Arrival date & time: 05/06/16  [id number]     History   Chief Complaint Chief Complaint  Patient presents with  . Insect Bite    HPI William Neal is a 60 y.o. male.  Patient is a 60 year old male with a history of hepatitis C and prior alcohol abuse presenting today after multiple stings by yellow jackets yesterday. He was outside working when he was stung. He was stung multiple times in the left arm and in the back of his neck. Since that time he has had continuing stinging pain and this morning noticed significant swelling of his left hand. He has never had a reaction to bee stings in the past. He denies any swelling of his mouth for trouble breathing. No diffuse rashes or itching.   The history is provided by the patient.    Past Medical History:  Diagnosis Date  . History of hepatitis C     Patient Active Problem List   Diagnosis Date Noted  . Other malaise and fatigue 04/24/2014  . Skin cyst 04/24/2014  . Routine general medical examination at a health care facility 10/12/2012  . History of hepatitis C 10/12/2012  . History of colon polyps 10/12/2012  . Urinary frequency 10/12/2012    Past Surgical History:  Procedure Laterality Date  . no prior surgery         Home Medications    Prior to Admission medications   Medication Sig Start Date End Date Taking? Authorizing Provider  fluticasone (FLONASE) 50 MCG/ACT nasal spray Place 2 sprays into both nostrils daily. Reported on 03/10/2016   Yes Historical Provider, MD  loratadine (CLARITIN) 10 MG tablet Take 10 mg by mouth daily. Reported on 03/10/2016   Yes Historical Provider, MD  tamsulosin (FLOMAX) 0.4 MG CAPS capsule Take 1 capsule (0.4 mg total) by mouth daily. 03/10/16  Yes Debbrah Alar, NP  predniSONE (DELTASONE) 20 MG tablet Take 2 tablets (40 mg total) by mouth daily. 05/06/16 05/10/16  Blanchie Dessert, MD    Family History Family History  Problem  Relation Age of Onset  . Diabetes Mother   . Emphysema Father   . Hypertension Maternal Uncle   . Coronary artery disease Brother     multiple stents, 1/2 brother  . Heart disease  80    died in his sleep, ?presumed heart attack  . Colon cancer Neg Hx   . Esophageal cancer Neg Hx   . Rectal cancer Neg Hx   . Stomach cancer Neg Hx     Social History Social History  Substance Use Topics  . Smoking status: Current Every Day Smoker    Packs/day: 1.00    Years: 35.00    Types: Cigarettes  . Smokeless tobacco: Never Used  . Alcohol use Yes     Comment: former heavy alcohol user, quit 9/16     Allergies   Review of patient's allergies indicates no known allergies.   Review of Systems Review of Systems  All other systems reviewed and are negative.    Physical Exam Updated Vital Signs BP 140/90 (BP Location: Left Arm)   Pulse (!) 50   Temp 97.7 F (36.5 C) (Oral)   Resp 18   Ht 5\' 11"  (1.803 m)   Wt 197 lb (89.4 kg)   SpO2 98%   BMI 27.48 kg/m   Physical Exam  Constitutional: He is oriented to person, place, and time. He appears well-developed and well-nourished. No  distress.  HENT:  Head: Normocephalic and atraumatic.  Mouth/Throat: Oropharynx is clear and moist.  No tongue or uvula edema  Eyes: Conjunctivae and EOM are normal. Pupils are equal, round, and reactive to light.  Neck: Normal range of motion. Neck supple.  Cardiovascular: Normal rate, regular rhythm and intact distal pulses.   No murmur heard. Pulmonary/Chest: Effort normal and breath sounds normal. No respiratory distress. He has no wheezes. He has no rales.  Musculoskeletal: Normal range of motion. He exhibits edema. He exhibits no tenderness.  Multiple bee stings over the left arm. Notable nonpitting edema of the left hand with 2+ radial pulse, normal sensation and less than 3 second capillary refill. Small stinging noted in the left occipital area of the scalp with mild surrounding erythema    Neurological: He is alert and oriented to person, place, and time.  Skin: Skin is warm and dry. No rash noted. No erythema.  Psychiatric: He has a normal mood and affect. His behavior is normal.  Nursing note and vitals reviewed.    ED Treatments / Results  Labs (all labs ordered are listed, but only abnormal results are displayed) Labs Reviewed - No data to display  EKG  EKG Interpretation None       Radiology No results found.  Procedures Procedures (including critical care time)  Medications Ordered in ED Medications  predniSONE (DELTASONE) tablet 60 mg (60 mg Oral Given 05/06/16 0834)     Initial Impression / Assessment and Plan / ED Course  I have reviewed the triage vital signs and the nursing notes.  Pertinent labs & imaging results that were available during my care of the patient were reviewed by me and considered in my medical decision making (see chart for details).  Clinical Course   Patient with multiple yellow jacket stings yesterday with localized reaction to the left hand. He denies any trouble breathing, oral swelling or diffuse itching. He did take Benadryl. No prior history of reaction to bee stings. Patient instructed to continue Benadryl and given prednisone.  Final Clinical Impressions(s) / ED Diagnoses   Final diagnoses:  Bee sting reaction, accidental or unintentional, initial encounter    New Prescriptions Discharge Medication List as of 05/06/2016  8:28 AM    START taking these medications   Details  predniSONE (DELTASONE) 20 MG tablet Take 2 tablets (40 mg total) by mouth daily., Starting Tue 05/06/2016, Until Sat 05/10/2016, Print         Blanchie Dessert, MD 05/06/16 [phone]

## 2016-05-06 NOTE — ED Triage Notes (Signed)
C/o bee sting x 4 to arm and back of head by yellow jackets. Localized swelling noted.

## 2016-05-12 MED FILL — TAMSULOSIN HCL 0.4 MG CAP: 0.4 | 30 days supply | Qty: 30 | Fill #2

## 2016-06-09 MED FILL — TAMSULOSIN HCL 0.4 MG CAP: 0.4 | 30 days supply | Qty: 30 | Fill #3

## 2016-06-11 ENCOUNTER — Ambulatory Visit (INDEPENDENT_AMBULATORY_CARE_PROVIDER_SITE_OTHER): Payer: Managed Care, Other (non HMO) | Admitting: Family

## 2016-06-11 ENCOUNTER — Ambulatory Visit: Payer: Managed Care, Other (non HMO) | Admitting: Family

## 2016-06-11 ENCOUNTER — Encounter: Payer: Self-pay | Admitting: Family

## 2016-06-11 DIAGNOSIS — N400 Enlarged prostate without lower urinary tract symptoms: Secondary | ICD-10-CM

## 2016-06-11 MED ORDER — TAMSULOSIN HCL 0.4 MG PO CAPS: 0.4000 mg | ORAL_CAPSULE | Freq: Every day | ORAL | 11 refills | Status: DC

## 2016-06-11 NOTE — Assessment & Plan Note (Signed)
Symptoms are improved with flomax, continue same.

## 2016-06-11 NOTE — Patient Instructions (Signed)
Please continue flomax.  Call if you have recurrent urinary problems.

## 2016-06-11 NOTE — Progress Notes (Signed)
   Subjective:    Patient ID: William Neal, male    DOB: [birth year], 58 y.o.   MRN: [id number]  HPI  Mr. Mongan is a 60 yr old male who presents today for BPH. Last visit he noted urinary frequency.  UA was unremarkable and PSA was WNL. He was given a trial of flomax. He is getting up less frequently at night.  Frequency is improved during the day. Voids without difficulty. He denies dizziness.    Review of Systems See HPI  Past Medical History:  Diagnosis Date  . History of hepatitis C      Social History   Social History  . Marital status: Married    Spouse name: N/A  . Number of children: N/A  . Years of education: N/A   Occupational History  . Not on file.   Social History Main Topics  . Smoking status: Current Every Day Smoker    Packs/day: 1.00    Years: 35.00    Types: Cigarettes  . Smokeless tobacco: Never Used  . 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 Drug use: No  . Sexual activity: Not on file   Other Topics Concern  . 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 no prior surgery      Family History  Problem Relation Age of Onset  . Diabetes Mother   . Emphysema Father   . Hypertension Maternal Uncle   . Coronary artery disease Brother     multiple stents, 1/2 brother  . Heart disease  80    died in his sleep, ?presumed heart attack  . Colon cancer Neg Hx   . Esophageal cancer Neg Hx   . Rectal cancer Neg Hx   . Stomach cancer Neg Hx     No Known Allergies  Current Outpatient Prescriptions on File Prior to Visit  Medication Sig Dispense Refill  . loratadine (CLARITIN) 10 MG tablet Take 10 mg by mouth daily. Reported on 03/10/2016     No current facility-administered medications on file prior to visit.     BP 113/73 (BP Location: Right  Arm, Cuff Size: Normal)   Pulse 60   Temp 98.6 F (37 C) (Oral)   Resp 16   Ht 5\' 11"  (1.803 m)   Wt 205 lb 3.2 oz (93.1 kg)   SpO2 98% Comment: room air  BMI 28.62 kg/m       Objective:   Physical Exam  Constitutional: He is oriented to person, place, and time. He appears well-developed and well-nourished. No distress.  HENT:  Head: Normocephalic and atraumatic.  Cardiovascular: Normal rate and regular rhythm.   No murmur heard. Pulmonary/Chest: Effort normal and breath sounds normal. No respiratory distress. He has no wheezes. He has no rales.  Musculoskeletal: He exhibits no edema.  Neurological: He is alert and oriented to person, place, and time.  Skin: Skin is warm and dry.  Psychiatric: He has a normal mood and affect. His behavior is normal. Thought content normal.          Assessment & Plan:  Declines flu shot today

## 2016-06-11 NOTE — Progress Notes (Signed)
Pre visit review using our clinic review tool, if applicable. No additional management support is needed unless otherwise documented below in the visit note. 

## 2016-07-10 MED FILL — TAMSULOSIN HCL 0.4 MG CAP: 0.4 | 30 days supply | Qty: 30 | Fill #0 | Status: TO

## 2016-08-08 ENCOUNTER — Telehealth: Payer: Self-pay | Admitting: *Deleted

## 2016-08-08 NOTE — Telephone Encounter (Signed)
Spouse sent mychart message in her account requesting that we change their pharmacy to [facility] in [place]. Requested we send new refills to [facility]. Advised her to have [facility] transfer remaining refills from [facility] and to let us know if they have any problems. Pharmacy list updated.

## 2016-10-08 ENCOUNTER — Ambulatory Visit (HOSPITAL_BASED_OUTPATIENT_CLINIC_OR_DEPARTMENT_OTHER)
Admission: RE | Admit: 2016-10-08 | Discharge: 2016-10-08 | Disposition: A | Discharge status: Home / Self Care | Payer: Managed Care, Other (non HMO) | Source: Ambulatory Visit | Attending: Family | Admitting: Family

## 2016-10-08 ENCOUNTER — Ambulatory Visit (INDEPENDENT_AMBULATORY_CARE_PROVIDER_SITE_OTHER): Payer: Managed Care, Other (non HMO) | Admitting: Family

## 2016-10-08 ENCOUNTER — Encounter: Payer: Self-pay | Admitting: Family

## 2016-10-08 VITALS — BP 137/75 | HR 67 | Temp 98.9°F | Resp 16 | Ht 71.0 in | Wt 206.2 lb

## 2016-10-08 DIAGNOSIS — R05 Cough: Secondary | ICD-10-CM

## 2016-10-08 DIAGNOSIS — R059 Cough, unspecified: Secondary | ICD-10-CM

## 2016-10-08 DIAGNOSIS — J111 Influenza due to unidentified influenza virus with other respiratory manifestations: Secondary | ICD-10-CM | POA: Insufficient documentation

## 2016-10-08 LAB — POCT INFLUENZA A: Rapid Influenza A Ag: POSITIVE

## 2016-10-08 MED ORDER — ALBUTEROL SULFATE (2.5 MG/3ML) 0.083% IN NEBU
2.5000 mg | INHALATION_SOLUTION | Freq: Once | RESPIRATORY_TRACT | Status: AC
  Administered 2016-10-08: 2.5 mg via RESPIRATORY_TRACT

## 2016-10-08 MED ORDER — ALBUTEROL SULFATE HFA 108 (90 BASE) MCG/ACT IN AERS: 2.0000 | INHALATION_SPRAY | Freq: Four times a day (QID) | RESPIRATORY_TRACT | 0 refills | Status: DC | PRN

## 2016-10-08 MED FILL — VENTOLIN HFA 90 MCG INHALER: 108 (90 BAS | 25 days supply | Qty: 18 | Fill #0

## 2016-10-08 NOTE — Patient Instructions (Signed)
Please complete chest x ray on the first floor. Call if new/worsening symptoms or if symptoms are not improved in 2-3 days.       Influenza, Adult Influenza, more commonly known as "the flu," is a viral infection that primarily affects the respiratory tract. The respiratory tract includes organs that help you breathe, such as the lungs, nose, and throat. The flu causes many common cold symptoms, as well as a high fever and body aches. The flu spreads easily from person to person (is contagious). Getting a flu shot (influenza vaccination) every year is the best way to prevent influenza. What are the causes? Influenza is caused by a virus. You can catch the virus by:  Breathing in droplets from an infected person's cough or sneeze.  Touching something that was recently contaminated with the virus and then touching your mouth, nose, or eyes. What increases the risk? The following factors may make you more likely to get the flu:  Not cleaning your hands frequently with soap and water or alcohol-based hand sanitizer.  Having close contact with many people during cold and flu season.  Touching your mouth, eyes, or nose without washing or sanitizing your hands first.  Not drinking enough fluids or not eating a healthy diet.  Not getting enough sleep or exercise.  Being under a high amount of stress.  Not getting a yearly (annual) flu shot. You may be at a higher risk of complications from the flu, such as a severe lung infection (pneumonia), if you:  Are over the age of 42.  Are pregnant.  Have a weakened disease-fighting system (immune system). You may have a weakened immune system if you:  Have HIV or AIDS.  Are undergoing chemotherapy.  Aretaking medicines that reduce the activity of (suppress) the immune system.  Have a long-term (chronic) illness, such as heart disease, kidney disease, diabetes, or lung disease.  Have a liver disorder.  Are obese.  Have  anemia. What are the signs or symptoms? Symptoms of this condition typically last 4-10 days and may include:  Fever.  Chills.  Headache, body aches, or muscle aches.  Sore throat.  Cough.  Runny or congested nose.  Chest discomfort and cough.  Poor appetite.  Weakness or tiredness (fatigue).  Dizziness.  Nausea or vomiting. How is this diagnosed? This condition may be diagnosed based on your medical history and a physical exam. Your health care provider may do a nose or throat swab test to confirm the diagnosis. How is this treated? If influenza is detected early, you can be treated with antiviral medicine that can reduce the length of your illness and the severity of your symptoms. This medicine may be given by mouth (orally) or through an IV tube that is inserted in one of your veins. The goal of treatment is to relieve symptoms by taking care of yourself at home. This may include taking over-the-counter medicines, drinking plenty of fluids, and adding humidity to the air in your home. In some cases, influenza goes away on its own. Severe influenza or complications from influenza may be treated in a hospital. Follow these instructions at home:  Take over-the-counter and prescription medicines only as told by your health care provider.  Use a cool mist humidifier to add humidity to the air in your home. This can make breathing easier.  Rest as needed.  Drink enough fluid to keep your urine clear or pale yellow.  Cover your mouth and nose when you cough or sneeze.  Wash your hands with soap and water often, especially after you cough or sneeze. If soap and water are not available, use hand sanitizer.  Stay home from work or school as told by your health care provider. Unless you are visiting your health care provider, try to avoid leaving home until your fever has been gone for 24 hours without the use of medicine.  Keep all follow-up visits as told by your health care  provider. This is important. How is this prevented?  Getting an annual flu shot is the best way to avoid getting the flu. You may get the flu shot in late summer, fall, or winter. Ask your health care provider when you should get your flu shot.  Wash your hands often or use hand sanitizer often.  Avoid contact with people who are sick during cold and flu season.  Eat a healthy diet, drink plenty of fluids, get enough sleep, and exercise regularly. Contact a health care provider if:  You develop new symptoms.  You have:  Chest pain.  Diarrhea.  A fever.  Your cough gets worse.  You produce more mucus.  You feel nauseous or you vomit. Get help right away if:  You develop shortness of breath or difficulty breathing.  Your skin or nails turn a bluish color.  You have severe pain or stiffness in your neck.  You develop a sudden headache or sudden pain in your face or ear.  You cannot stop vomiting. This information is not intended to replace advice given to you by your health care provider. Make sure you discuss any questions you have with your health care provider. Document Released: 08/15/2000 Document Revised: 01/24/2016 Document Reviewed: 06/12/2015 Elsevier Interactive Patient Education  2017 [employer].

## 2016-10-08 NOTE — Progress Notes (Signed)
Pre visit review using our clinic review tool, if applicable. No additional management support is needed unless otherwise documented below in the visit note. 

## 2016-10-08 NOTE — Progress Notes (Signed)
Subjective:    Patient ID: William Neal, male    DOB: [birth year], 61 y.o.   MRN: [id number]  HPI  [name] is a 61 yr old male who presents today with c/o nasal congestion, cough, headache, diarrhea. Reports symptoms began on Friday.  Yesterday "couldn't hold my head up."  Has been using mucinex prn.  Does report + body aches.  + sinus Headaches. Feels like he is having trouble "catching my breath."    Review of Systems See HPI  Past Medical History:  Diagnosis Date  . BPH (benign prostatic hyperplasia) 10/12/2012  . History of hepatitis C      Social History   Social History  . Marital status: Married    Spouse name: N/A  . Number of children: N/A  . Years of education: N/A   Occupational History  . Not on file.   Social History Main Topics  . Smoking status: Current Every Day Smoker    Packs/day: 1.00    Years: 35.00    Types: Cigarettes  . Smokeless tobacco: Never Used  . 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 Drug use: No  . Sexual activity: Not on file   Other Topics Concern  . 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 no prior surgery      Family History  Problem Relation Age of Onset  . Diabetes Mother   . Emphysema Father   . Hypertension Maternal Uncle   . Coronary artery disease Brother     multiple stents, 1/2 brother  . Heart disease  80    died in his sleep, ?presumed heart attack  . Colon cancer Neg Hx   . Esophageal cancer Neg Hx   . Rectal cancer Neg Hx   . Stomach cancer Neg Hx     No Known Allergies  Current Outpatient Prescriptions on File Prior to Visit  Medication Sig Dispense Refill  . loratadine (CLARITIN) 10 MG tablet Take 10 mg by mouth daily. Reported on 03/10/2016     No current facility-administered medications  on file prior to visit.     BP 137/75 (BP Location: Right Arm, Cuff Size: Normal)   Pulse 67   Temp 98.9 F (37.2 C) (Oral)   Resp 16   Ht 5\' 11"  (1.803 m)   Wt 206 lb 3.2 oz (93.5 kg)   SpO2 98% Comment: room air  BMI 28.76 kg/m       Objective:   Physical Exam  Constitutional: He is oriented to person, place, and time. He appears well-developed and well-nourished. No distress.  HENT:  Head: Normocephalic and atraumatic.  Right Ear: Tympanic membrane and ear canal normal.  Left Ear: Tympanic membrane and ear canal normal.  Mouth/Throat: Posterior oropharyngeal erythema present. No oropharyngeal exudate or posterior oropharyngeal edema.  Eyes: Left eye exhibits discharge.  Neck: Neck supple.  Cardiovascular: Normal rate and regular rhythm.   No murmur heard. Pulmonary/Chest: Effort normal and breath sounds normal. No respiratory distress. He has no wheezes. He has no rales.  Musculoskeletal: He exhibits no edema.  Lymphadenopathy:    He has no cervical adenopathy.  Neurological: He is alert and oriented to person, place, and time.  Skin: Skin  is warm and dry.  Psychiatric: He has a normal mood and affect. His behavior is normal. Thought content normal.          Assessment & Plan:  Influenza- rapid flu is positive. Will check cxr to rule out pneumonia.  Albuterol neb to be given in the office today. Rx provided for albuterol MDI. Pt is advised to call if new/worsening symptoms or if symptoms are not improved in 3 days.

## 2016-12-10 ENCOUNTER — Encounter: Payer: Self-pay | Admitting: Family Medicine

## 2016-12-10 ENCOUNTER — Ambulatory Visit (INDEPENDENT_AMBULATORY_CARE_PROVIDER_SITE_OTHER): Payer: Managed Care, Other (non HMO) | Admitting: Family Medicine

## 2016-12-10 VITALS — BP 110/60 | HR 70 | Temp 98.0°F | Ht 71.0 in | Wt 196.4 lb

## 2016-12-10 DIAGNOSIS — J209 Acute bronchitis, unspecified: Secondary | ICD-10-CM | POA: Diagnosis not present

## 2016-12-10 MED ORDER — PREDNISONE 50 MG PO TABS: ORAL_TABLET | ORAL | 0 refills | Status: DC

## 2016-12-10 MED ORDER — AZITHROMYCIN 250 MG PO TABS: ORAL_TABLET | ORAL | 0 refills | Status: DC

## 2016-12-10 MED FILL — AZITHROMYCIN 250 MG TABLET: 250 | 5 days supply | Qty: 6 | Fill #0

## 2016-12-10 MED FILL — predniSONE 50 MG TABS: 50 | 5 days supply | Qty: 5 | Fill #0

## 2016-12-10 NOTE — Patient Instructions (Signed)
Continue to push fluids, practice good hand hygiene, and cover your mouth if you cough.  If you start having continued fevers, shaking or worsening shortness of breath, seek immediate care.  Stay off of cigarettes. Try eating carrots as a substitute .

## 2016-12-10 NOTE — Progress Notes (Signed)
Chief Complaint  Patient presents with  . URI    cough(product-white and yellow) and dry-runny nose,h/a,trouble breathing,bodyaches,fever,head and chest congestion-sxs started on over the weekend    Brand Males here for URI complaints.  Duration: 4 days  Associated symptoms: sinus congestion, rhinorrhea, itchy watery eyes, ear fullness, shortness of breath, myalgia and cough Denies: ear drainage and sore throat Treatment to date: Mucinex Sick contacts: Yes- Wife  ROS:  Const: +fevers HEENT: As noted in HPI Lungs: +SOB  Past Medical History:  Diagnosis Date  . BPH (benign prostatic hyperplasia) 10/12/2012  . History of hepatitis C    Family History  Problem Relation Age of Onset  . Diabetes Mother   . Emphysema Father   . Hypertension Maternal Uncle   . Coronary artery disease Brother     multiple stents, 1/2 brother  . Heart disease  80    died in his sleep, ?presumed heart attack  . Colon cancer Neg Hx   . Esophageal cancer Neg Hx   . Rectal cancer Neg Hx   . Stomach cancer Neg Hx     BP 110/60 (BP Location: Left Arm, Patient Position: Sitting, Cuff Size: Normal)   Pulse 70   Temp 98 F (36.7 C) (Oral)   Ht 5\' 11"  (1.803 m)   Wt 196 lb 6.4 oz (89.1 kg)   SpO2 96%   BMI 27.39 kg/m  General: Awake, alert, appears stated age HEENT: AT, [place], ears patent b/l and TM neg on L, some fluid behind TM on R, no erythema or bulging, nares patent w/o discharge, pharynx pink and without exudates, MMM Neck: No masses or asymmetry Heart: RRR, no murmurs, distant heart sounds Lungs: +expiratory wheezes heard at bases, no accessory muscle use Psych: Age appropriate judgment and insight, normal mood and affect  Acute wheezy bronchitis - Plan: predniSONE (DELTASONE) 50 MG tablet, azithromycin (ZITHROMAX) 250 MG tablet  Orders as above. Given fever will tx for bacterial infection. Continue to push fluids, practice good hand hygiene, cover mouth when coughing. F/u prn. If  starting to experience fevers, shaking, or shortness of breath, seek immediate care. Pt voiced understanding and agreement to the plan.  [place], DO 12/10/16 8:14 AM

## 2016-12-10 NOTE — Progress Notes (Signed)
Pre visit review using our clinic review tool, if applicable. No additional management support is needed unless otherwise documented below in the visit note. 

## 2017-03-13 ENCOUNTER — Encounter: Payer: Managed Care, Other (non HMO) | Admitting: Family

## 2017-03-27 ENCOUNTER — Ambulatory Visit (INDEPENDENT_AMBULATORY_CARE_PROVIDER_SITE_OTHER): Payer: Managed Care, Other (non HMO) | Admitting: Family

## 2017-03-27 ENCOUNTER — Encounter: Payer: Self-pay | Admitting: Family

## 2017-03-27 VITALS — BP 112/72 | HR 52 | Temp 97.8°F | Ht 71.0 in | Wt 199.1 lb

## 2017-03-27 DIAGNOSIS — G43909 Migraine, unspecified, not intractable, without status migrainosus: Secondary | ICD-10-CM | POA: Diagnosis not present

## 2017-03-27 DIAGNOSIS — Z8619 Personal history of other infectious and parasitic diseases: Secondary | ICD-10-CM | POA: Diagnosis not present

## 2017-03-27 DIAGNOSIS — Z125 Encounter for screening for malignant neoplasm of prostate: Secondary | ICD-10-CM | POA: Diagnosis not present

## 2017-03-27 DIAGNOSIS — Z0000 Encounter for general adult medical examination without abnormal findings: Secondary | ICD-10-CM | POA: Diagnosis not present

## 2017-03-27 LAB — URINALYSIS, ROUTINE W REFLEX MICROSCOPIC
BILIRUBIN URINE: NEGATIVE
Hgb urine dipstick: NEGATIVE
KETONES UR: NEGATIVE
LEUKOCYTES UA: NEGATIVE
NITRITE: NEGATIVE
RBC / HPF: NONE SEEN (ref 0–?)
SPECIFIC GRAVITY, URINE: 1.025 (ref 1.000–1.030)
Total Protein, Urine: NEGATIVE
URINE GLUCOSE: NEGATIVE
UROBILINOGEN UA: 0.2 (ref 0.0–1.0)
pH: 6 (ref 5.0–8.0)

## 2017-03-27 LAB — LIPID PANEL
Cholesterol: 181 mg/dL (ref 0–200)
HDL: 27.3 mg/dL — ABNORMAL LOW (ref >39.00)
LDL Cholesterol: 117 mg/dL — ABNORMAL HIGH (ref 0–99)
NonHDL: 153.95
Total CHOL/HDL Ratio: 7
Triglycerides: 187 mg/dL — ABNORMAL HIGH (ref 0.0–149.0)
VLDL: 37.4 mg/dL (ref 0.0–40.0)

## 2017-03-27 LAB — BASIC METABOLIC PANEL
BUN: 14 mg/dL (ref 6–23)
CO2: 25 mEq/L (ref 19–32)
Calcium: 9.8 mg/dL (ref 8.4–10.5)
Chloride: 107 mEq/L (ref 96–112)
Creatinine, Ser: 0.93 mg/dL (ref 0.40–1.50)
GFR: 88.49 mL/min (ref >60.00)
Glucose, Bld: 110 mg/dL — ABNORMAL HIGH (ref 70–99)
Potassium: 4.2 mEq/L (ref 3.5–5.1)
Sodium: 141 mEq/L (ref 135–145)

## 2017-03-27 LAB — CBC WITH DIFFERENTIAL/PLATELET
Basophils Absolute: 0.1 10*3/uL (ref 0.0–0.1)
Basophils Relative: 0.8 % (ref 0.0–3.0)
Eosinophils Absolute: 0.1 10*3/uL (ref 0.0–0.7)
Eosinophils Relative: 1.5 % (ref 0.0–5.0)
HCT: 46.9 % (ref 39.0–52.0)
Hemoglobin: 15.8 g/dL (ref 13.0–17.0)
Lymphocytes Relative: 19.3 % (ref 12.0–46.0)
Lymphs Abs: 1.5 10*3/uL (ref 0.7–4.0)
MCHC: 33.7 g/dL (ref 30.0–36.0)
MCV: 97.3 fl (ref 78.0–100.0)
Monocytes Absolute: 0.8 10*3/uL (ref 0.1–1.0)
Monocytes Relative: 10.6 % (ref 3.0–12.0)
Neutro Abs: 5.1 10*3/uL (ref 1.4–7.7)
Neutrophils Relative %: 67.8 % (ref 43.0–77.0)
Platelets: 228 10*3/uL (ref 150.0–400.0)
RBC: 4.82 Mil/uL (ref 4.22–5.81)
RDW: 13.3 % (ref 11.5–15.5)
WBC: 7.5 10*3/uL (ref 4.0–10.5)

## 2017-03-27 LAB — PSA: PSA: 0.5 ng/mL (ref 0.10–4.00)

## 2017-03-27 LAB — TSH: TSH: 1.82 u[IU]/mL (ref 0.35–4.50)

## 2017-03-27 LAB — HEPATIC FUNCTION PANEL
ALT: 15 U/L (ref 0–53)
AST: 17 U/L (ref 0–37)
Albumin: 4.4 g/dL (ref 3.5–5.2)
Alkaline Phosphatase: 84 U/L (ref 39–117)
BILIRUBIN DIRECT: 0.1 mg/dL (ref 0.0–0.3)
BILIRUBIN TOTAL: 0.5 mg/dL (ref 0.2–1.2)
TOTAL PROTEIN: 6.8 g/dL (ref 6.0–8.3)

## 2017-03-27 MED ORDER — SUMATRIPTAN SUCCINATE 50 MG PO TABS: ORAL_TABLET | ORAL | 5 refills | Status: DC

## 2017-03-27 MED FILL — SUMATRIPTAN SUCC 50 MG TAB: 50 | 30 days supply | Qty: 10 | Fill #0

## 2017-03-27 NOTE — Progress Notes (Signed)
Pre visit review using our clinic review tool, if applicable. No additional management support is needed unless otherwise documented below in the visit note. 

## 2017-03-27 NOTE — Patient Instructions (Addendum)
Please call [facility] to schedule follow up colonoscopy- [phone] Please schedule a routine eye exam.   Please schedule routine eye exam.   Try to work on getting 30 minutes of walking 5 days a week. It is important that you quit smoking. Start imitrex as needed for migraines.

## 2017-03-27 NOTE — Progress Notes (Signed)
Subjective:    Patient ID: William Neal, male    DOB: 07-19-1956, 61 y.o.   MRN: [id number]  HPI  Patient presents today for complete physical.  Immunizations: up to date.  Diet: needs improvement.  Needs to eat more vegetables.  Exercise: active in his job/yard  No formal exercise.  Colonoscopy:  Was due to repeat   Vision:  due Dental: due   Hx of migraines- Reports that he sometimes has "squiggly line" in his vision- lasts 20 minutes. He usually does not have headache when he has these visual changes. Having migraines. These occur about twice a month. He has associated photophobia and phonophobia with his migraines.  Review of Systems  Constitutional: Negative for unexpected weight change.  Eyes: Positive for visual disturbance.       See hpi  Respiratory: Negative for shortness of breath.        Mild "smoker cough"  Cardiovascular: Negative for chest pain and leg swelling.  Gastrointestinal: Negative for blood in stool, constipation and diarrhea.  Genitourinary: Negative for dysuria, frequency and hematuria.  Musculoskeletal: Negative for arthralgias and myalgias.  Skin: Negative for rash.  Neurological: Negative for headaches.  Hematological: Negative for adenopathy.  Psychiatric/Behavioral:       Denies depression/anxiety   Past Medical History:  Diagnosis Date  . BPH (benign prostatic hyperplasia) 10/12/2012  . History of hepatitis C      Social History   Social History  . Marital status: Married    Spouse name: N/A  . Number of children: N/A  . Years of education: N/A   Occupational History  . Not on file.   Social History Main Topics  . Smoking status: Former Smoker    Packs/day: 1.00    Years: 35.00    Types: Cigarettes    Quit date: 12/07/2016  . Smokeless tobacco: Never Used  . 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 Drug use: No  . Sexual activity: Not on file   Other Topics Concern  . 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 no prior surgery      Family History  Problem Relation Age of Onset  . Diabetes Mother   . Emphysema Father   . Hypertension Maternal Uncle   . Coronary artery disease Brother        multiple stents, 1/2 brother  . Heart disease Unknown 80       died in his sleep, ?presumed heart attack  . Hypertension Sister   . Colon cancer Neg Hx   . Esophageal cancer Neg Hx   . Rectal cancer Neg Hx   . Stomach cancer Neg Hx     No Known Allergies  No current outpatient prescriptions on file prior to visit.   No current facility-administered medications on file prior to visit.     BP 112/72 (BP Location: Left Arm, Patient Position: Sitting, Cuff Size: Normal)   Pulse (!) 52   Temp 97.8 F (36.6 C) (Oral)   Ht 5\' 11"  (1.803 m)   Wt 199 lb 2 oz (90.3 kg)   SpO2 97%   BMI 27.77 kg/m       Objective:   Physical Exam  Physical Exam  Constitutional: He is oriented to person, place, and time. He appears well-developed and well-nourished. No distress.  HENT:  Head: Normocephalic and atraumatic.  Right Ear: Tympanic membrane and ear canal normal.  Left Ear: Tympanic membrane and ear canal normal.  Mouth/Throat: Oropharynx is clear and moist.  Eyes: Pupils are equal, round, and reactive to light. No scleral icterus.  Neck: Normal range of motion. No thyromegaly present.  Cardiovascular: Normal rate and regular rhythm.   No murmur heard. Pulmonary/Chest: Effort normal and breath sounds normal. No respiratory distress. He has no wheezes. He has no rales. He exhibits no tenderness.  Abdominal: Soft. Bowel sounds are normal. He exhibits no distension and no mass. There is no tenderness. There is no rebound and no guarding.  Musculoskeletal: He exhibits no edema.  Lymphadenopathy:    He has  no cervical adenopathy.  Neurological: He is alert and oriented to person, place, and time.  He exhibits normal muscle tone. Coordination normal.  Skin: Skin is warm and dry.  Psychiatric: He has a normal mood and affect. His behavior is normal. Judgment and thought content normal.           Assessment & Plan:   Preventative Care- discussed healthy diet, regular exercise and smoking cessation.  He declined referral for follow up colonoscopy at this time. Discussed importance of colo follow up. Will obtain routine lab work. Also recommended that he schedule routine dental care.  History of hepatitis C-status post treatment. He also requests follow up hep C viral load. Previously undetectable. Will obtain.  Migraine-sounds like he has some typical migraines as well as some optical migraines. Will give trial of Imitrex. Recommend a referral to ophthalmology as well he declines at this time.     Assessment & Plan:

## 2017-03-29 ENCOUNTER — Encounter: Payer: Self-pay | Admitting: Family

## 2017-03-31 LAB — HEPATITIS C RNA QUANTITATIVE
HCV QUANT: NOT DETECTED [IU]/mL
HCV Quantitative Log: <1.18 Log IU/mL

## 2017-05-29 ENCOUNTER — Encounter: Payer: Self-pay | Admitting: Family

## 2017-05-29 ENCOUNTER — Ambulatory Visit (INDEPENDENT_AMBULATORY_CARE_PROVIDER_SITE_OTHER): Payer: Managed Care, Other (non HMO) | Admitting: Family

## 2017-05-29 ENCOUNTER — Ambulatory Visit (HOSPITAL_BASED_OUTPATIENT_CLINIC_OR_DEPARTMENT_OTHER)
Admission: RE | Admit: 2017-05-29 | Discharge: 2017-05-29 | Disposition: A | Discharge status: Home / Self Care | Payer: Managed Care, Other (non HMO) | Source: Ambulatory Visit | Attending: Family | Admitting: Family

## 2017-05-29 VITALS — BP 133/77 | HR 54 | Temp 98.0°F | Resp 18 | Ht 71.0 in | Wt 202.8 lb

## 2017-05-29 DIAGNOSIS — Z23 Encounter for immunization: Secondary | ICD-10-CM

## 2017-05-29 DIAGNOSIS — M79642 Pain in left hand: Secondary | ICD-10-CM | POA: Insufficient documentation

## 2017-05-29 DIAGNOSIS — M79641 Pain in right hand: Secondary | ICD-10-CM | POA: Insufficient documentation

## 2017-05-29 DIAGNOSIS — M2540 Effusion, unspecified joint: Secondary | ICD-10-CM

## 2017-05-29 LAB — SEDIMENTATION RATE: SED RATE: 2 mm/h (ref 0–20)

## 2017-05-29 LAB — URIC ACID: Uric Acid, Serum: 5.9 mg/dL (ref 4.0–7.8)

## 2017-05-29 MED ORDER — MELOXICAM 7.5 MG PO TABS: 7.5000 mg | ORAL_TABLET | Freq: Every day | ORAL | 0 refills | Status: DC

## 2017-05-29 MED FILL — MELOXICAM 7.5 MG TABLET: 7.5 | 30 days supply | Qty: 30 | Fill #0

## 2017-05-29 NOTE — Progress Notes (Signed)
Subjective:    Patient ID: William Neal, male    DOB: [birth year], 61 y.o.   MRN: [id number]  HPI  William Neal is a 61 yr old male who presents today wit bilateral hand pain. Reports that hand pain has been present x >1 year.  Felt like the right middle finger dislocated at the base yesterday. Notes occasional knee pain "before it rains."  Denies fever.  Feels that his hand pain and swelling is interfering with his work.  Having difficulty grasping ladder due to pain.   Review of Systems See HPI  Past Medical History:  Diagnosis Date  . BPH (benign prostatic hyperplasia) 10/12/2012  . History of hepatitis C      Social History   Social History  . Marital status: Married    Spouse name: N/A  . Number of children: N/A  . Years of education: N/A   Occupational History  . Not on file.   Social History Main Topics  . Smoking status: Current Every Day Smoker    Packs/day: 1.00    Years: 35.00    Types: Cigarettes    Last attempt to quit: 12/07/2016  . Smokeless tobacco: Never Used  . 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 Drug use: No  . Sexual activity: Not on file   Other Topics Concern  . Not on file   Social History Narrative   Married to William Neal, recycle program   Married   2 step children   Enjoys fishing   Completed college    Past Surgical History:  Procedure Laterality Date  . no prior surgery      Family History  Problem Relation Age of Onset  . Diabetes Mother   . Emphysema Father   . Hypertension Maternal Uncle   . Coronary artery disease Brother        multiple stents, 1/2 brother  . Heart disease Unknown 80       died in his sleep, ?presumed heart attack  . Hypertension Sister   . Colon cancer Neg Hx   . Esophageal cancer Neg Hx   . Rectal cancer Neg Hx   . Stomach cancer Neg Hx     No Known Allergies  Current Outpatient Prescriptions on File Prior to Visit  Medication Sig Dispense Refill  .  SUMAtriptan (IMITREX) 50 MG tablet One tab by mouth at start of migraine, may repeat 1 tab in 2 hours if not resolved. 10 tablet 5   No current facility-administered medications on file prior to visit.     BP 133/77 (BP Location: Left Arm, Cuff Size: Normal)   Pulse (!) 54   Temp 98 F (36.7 C) (Oral)   Resp 18   Ht 5' 11"  (1.803 m)   Wt 202 lb 12.8 oz (92 kg)   SpO2 97%   BMI 28.28 kg/m       Objective:   Physical Exam  Constitutional: He is oriented to person, place, and time. He appears well-developed and well-nourished. No distress.  HENT:  Head: Normocephalic and atraumatic.  Cardiovascular: Normal rate and regular rhythm.   No murmur heard. Pulmonary/Chest: Effort normal and breath sounds normal. No respiratory distress. He has no wheezes. He has no rales.  Musculoskeletal: He exhibits no edema.  Swelling of all finger joints, both hands.    Neurological: He is alert and oriented to person, place, and time.  Skin: Skin is  warm and dry.  Psychiatric: He has a normal mood and affect. His behavior is normal. Thought content normal.          Assessment & Plan:  Bilateral hand pain-  ? OA versus RA. Will obtain ANA, ESR, Rheumatoid factor. He is being treated for a dental abscess which may raise his ESR.  Obtain x ray of right hand for further evaluation. Consider referral to rheumatology pending review of lab work.  In the meantime, will give trial of meloxicam. Flu shot today.

## 2017-05-29 NOTE — Patient Instructions (Signed)
Please complete lab work prior to leaving. Complete xray on the first floor. Begin meloxicam once daily. We will contact you with your results and further recommendations.

## 2017-05-31 ENCOUNTER — Encounter: Payer: Self-pay | Admitting: Family

## 2017-05-31 DIAGNOSIS — M05741 Rheumatoid arthritis with rheumatoid factor of right hand without organ or systems involvement: Secondary | ICD-10-CM

## 2017-05-31 DIAGNOSIS — M05742 Rheumatoid arthritis with rheumatoid factor of left hand without organ or systems involvement: Principal | ICD-10-CM

## 2017-06-01 ENCOUNTER — Encounter: Payer: Self-pay | Admitting: Family

## 2017-06-01 LAB — ANA: ANA: NEGATIVE

## 2017-06-01 LAB — RHEUMATOID FACTOR: Rhuematoid fact SerPl-aCnc: 35 IU/mL — ABNORMAL HIGH (ref <14)

## 2017-06-26 ENCOUNTER — Ambulatory Visit: Payer: Managed Care, Other (non HMO) | Admitting: Family

## 2017-07-06 ENCOUNTER — Other Ambulatory Visit: Payer: Self-pay | Admitting: Family

## 2017-07-07 MED FILL — MELOXICAM 7.5 MG TABLET: 7.5 | 30 days supply | Qty: 30 | Fill #0

## 2017-08-11 ENCOUNTER — Other Ambulatory Visit: Payer: Self-pay | Admitting: Family

## 2017-08-12 ENCOUNTER — Other Ambulatory Visit: Payer: Self-pay | Admitting: Family

## 2017-08-12 MED ORDER — MELOXICAM 7.5 MG PO TABS
7.5000 mg | ORAL_TABLET | Freq: Every day | ORAL | 1 refills | Status: DC
Start: 2017-08-12 — End: 2017-10-16

## 2017-08-12 MED FILL — MELOXICAM 7.5 MG TABLET: 7.5 | 30 days supply | Qty: 30 | Fill #0 | Status: TO

## 2017-10-05 ENCOUNTER — Encounter: Payer: Self-pay | Admitting: Family

## 2017-10-05 ENCOUNTER — Ambulatory Visit (INDEPENDENT_AMBULATORY_CARE_PROVIDER_SITE_OTHER): Payer: 59 | Admitting: Family

## 2017-10-05 VITALS — BP 152/79 | HR 64 | Temp 98.4°F | Resp 16 | Ht 71.0 in | Wt 210.0 lb

## 2017-10-05 DIAGNOSIS — R55 Syncope and collapse: Secondary | ICD-10-CM | POA: Diagnosis not present

## 2017-10-05 NOTE — Patient Instructions (Signed)
Please go to the ER for further evaluation on the first floor.

## 2017-10-05 NOTE — Progress Notes (Signed)
Subjective:    Patient ID: William Neal, male    DOB: [birth year], 62 y.o.   MRN: [id number]  HPI  William Neal is a 62 yr old male who presents today with several complaints.  He reports chills/sinus pressure/fatigue x 2 days as well as a 2 day hx of diarrhea. Last episode of diarrhea was this AM "pretty severe."    Had episode of syncope this AM.  This AM he reports that he went to his truck, became dizzy, saw "pinpoints of light."  Next thing he knew he woke up in his truck and 15 minutes had passed.  He then decided that he probably shouldn't drive and went inside and laid down in front of the television. Reports that he woke up several hours later when his wife walked in. He reports that he had no loss of bowel or bladder.   He denies abdominal pain, nausea/vomitting.    Reports that he has intermittent blurring/sees squiggles yesterday. Reports chronic headaches. Denies cp/sob.     Review of Systems See HPI  Past Medical History:  Diagnosis Date  . BPH (benign prostatic hyperplasia) 10/12/2012  . History of hepatitis C      Social History   Socioeconomic History  . Marital status: Married    Spouse name: Not on file  . Number of children: Not on file  . Years of education: Not on file  . Highest education level: Not on file  Social Needs  . Financial resource strain: Not on file  . Food insecurity - worry: Not on file  . Food insecurity - inability: Not on file  . Transportation needs - medical: Not on file  . Transportation needs - non-medical: Not on file  Occupational History  . Not on file  Tobacco Use  . Smoking status: Current Every Day Smoker    Packs/day: 1.00    Years: 35.00    Pack years: 35.00    Types: Cigarettes    Last attempt to quit: 12/07/2016    Years since quitting: 0.8  . Smokeless tobacco: Never Used  Substance and Sexual Activity  . 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 Drug use: No  . Sexual activity: Not on  file  Other Topics Concern  . 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 no prior surgery      Family History  Problem Relation Age of Onset  . Diabetes Mother   . Emphysema Father   . Hypertension Maternal Uncle   . Coronary artery disease Brother        multiple stents, 1/2 brother  . Heart disease Unknown 80       died in his sleep, ?presumed heart attack  . Hypertension Sister   . Colon cancer Neg Hx   . Esophageal cancer Neg Hx   . Rectal cancer Neg Hx   . Stomach cancer Neg Hx     No Known Allergies  Current Outpatient Medications on File Prior to Visit  Medication Sig Dispense Refill  . meloxicam (MOBIC) 7.5 MG tablet Take 1 tablet (7.5 mg total) by mouth daily. 30 tablet 1  . SUMAtriptan (IMITREX) 50 MG tablet One tab by mouth at start of migraine, may repeat  1 tab in 2 hours if not resolved. 10 tablet 5  . traMADol (ULTRAM) 50 MG tablet Take 50 mg by mouth 4 (four) times daily as needed.     No current facility-administered medications on file prior to visit.     BP (!) 152/79 (BP Location: Right Arm, Patient Position: Standing, Cuff Size: Large)   Pulse 64   Temp 98.4 F (36.9 C) (Oral)   Resp 16   Ht 5\' 11"  (1.803 m)   Wt 210 lb (95.3 kg)   SpO2 96%   BMI 29.29 kg/m       Objective:   Physical Exam  Constitutional: He is oriented to person, place, and time. He appears well-developed and well-nourished. No distress.  HENT:  Head: Normocephalic and atraumatic.  Eyes: EOM are normal. Pupils are equal, round, and reactive to light.  Cardiovascular: Normal rate and regular rhythm.  No murmur heard. Pulmonary/Chest: Effort normal and breath sounds normal. No respiratory distress. He has no wheezes. He has no rales.  Musculoskeletal: He exhibits no edema.  Neurological: He is alert  and oriented to person, place, and time. He exhibits normal muscle tone. Coordination normal.  Skin: Skin is warm and dry.  Psychiatric: He has a normal mood and affect. His behavior is normal. Thought content normal.          Assessment & Plan:  Syncope- it sounds like he experienced a migraine yesterday.  Syncope could be related to dehydration however he is not significantly orthostatic today. CVA. Cardiac etiology and seizure are also considerations.  EKG notes NSR.  I have recommended further evaluation in the ED. Marland Kitchen Report given to Jola Schmidt MD in the [facility].    A total of 30  minutes were spent face-to-face with the patient during this encounter and over half of that time was spent on counseling and coordination of care. The patient was counseled on syncope, causes and need for further work up.

## 2017-10-06 ENCOUNTER — Telehealth: Payer: Self-pay | Admitting: Family

## 2017-10-06 NOTE — Telephone Encounter (Signed)
Left message for pt to return my call.

## 2017-10-06 NOTE — Telephone Encounter (Signed)
Spoke with pt. He states he did not see the need for an ER evaluation and went to a [facility] and received treatment for a sinus infection. He wants to wait until his infection clears and see if he has any further episodes. Discussed at length need for further evaluation / workup to determine cause of his syncopal episode and importance for further workup as well as possible risks of waiting. Pt declines any further workup at this time and again states he will contact us if he has any further episodes.

## 2017-10-06 NOTE — Telephone Encounter (Signed)
Please contact patient and ask him if he was able to go to the emergency department.  I do not see records within our system.  Please advise him that it is still my recommendation that he go to the ER for further evaluation.  However if he is unwilling to go to the ER then we should bring him back in to do some additional testing.  I would advise him not to drive until further evaluation and clearance.

## 2017-10-07 NOTE — Telephone Encounter (Signed)
Noted  

## 2017-10-15 NOTE — Progress Notes (Signed)
Office Visit Note  Patient: William Neal             Date of Birth: [birth year]           MRN: [id number]             PCP: Debbrah Alar, NP Referring: Debbrah Alar, NP Visit Date: 10/29/2017 Occupation: Stage manager    Subjective:  In bilateral hands and multiple joints.   History of Present Illness: William Neal is a 62 y.o. male seen in consultation per request of his PCP.  According to patient he has had history of alcoholism for about 25 years.  He stopped drinking about 3 years ago and noticed that he was having pain swelling and deformities in his hands.  He was having swelling to the point he was unable to make a fist.  About 4 months ago he went to his PCP and had labs which were positive for rheumatoid factor.  He was diagnosed with rheumatoid arthritis and started on meloxicam which has been helpful to some extent to reduce swelling.  He continues to have discomfort in his bilateral hands, bilateral hip joints, bilateral knee joints and his ankle joints.  He recalls that he was diagnosed with hepatitis C in the 90s and was treated for hepatitis C. he was told that he was cured from hepatitis C.  He had written recent testing by his PCP for hep C RNA quant which was negative.  Activities of Daily Living:  Patient reports morning stiffness for 1 hour.   Patient Denies nocturnal pain.  Difficulty dressing/grooming: Denies Difficulty climbing stairs: Reports Difficulty getting out of chair: Reports Difficulty using hands for taps, buttons, cutlery, and/or writing: Reports   Review of Systems  Constitutional: Positive for fatigue. Negative for weakness.  HENT: Negative for trouble swallowing and trouble swallowing.   Eyes: Positive for redness and dryness. Negative for pain and visual disturbance.  Respiratory: Positive for shortness of breath.   Cardiovascular: Negative for swelling in legs/feet.  Gastrointestinal: Negative for heartburn.    Endocrine: Negative for excessive thirst.  Genitourinary: Negative for difficulty urinating.  Musculoskeletal: Positive for arthralgias, joint pain, joint swelling and morning stiffness. Negative for muscle weakness and muscle tenderness.  Skin: Negative for rash.  Neurological: Positive for headaches.  Hematological: Negative for bruising/bleeding tendency.  Psychiatric/Behavioral: Negative for sleep disturbance.    PMFS History:  Patient Active Problem List   Diagnosis Date Noted  . Other malaise and fatigue 04/24/2014  . Skin cyst 04/24/2014  . Routine general medical examination at a health care facility 10/12/2012  . History of hepatitis C 10/12/2012  . History of colon polyps 10/12/2012  . BPH (benign prostatic hyperplasia) 10/12/2012    Past Medical History:  Diagnosis Date  . BPH (benign prostatic hyperplasia) 10/12/2012  . History of hepatitis C     Family History  Problem Relation Age of Onset  . Diabetes Mother   . Emphysema Father   . Hypertension Maternal Uncle   . Coronary artery disease Brother        multiple stents, 1/2 brother  . Heart disease Unknown 80       died in his sleep, ?presumed heart attack  . Hypertension Sister   . Colon cancer Neg Hx   . Esophageal cancer Neg Hx   . Rectal cancer Neg Hx   . Stomach cancer Neg Hx    Past Surgical History:  Procedure Laterality Date  . 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 He appears well-developed and well-nourished.  HENT:  Head: Normocephalic and atraumatic.  Eyes: Conjunctivae  and EOM are normal. Pupils are equal, round, and reactive to light.  Neck: Normal range of motion. Neck supple.  Cardiovascular: Normal rate, regular rhythm and normal heart sounds.  Pulmonary/Chest: Effort normal and breath sounds normal.  Abdominal: Soft. Bowel sounds are normal.  Neurological: He is alert and oriented to person, place, and time.  Skin: Skin is warm and dry. Capillary refill takes less than 2 seconds.  Psychiatric: He has a normal mood and affect. His behavior is normal.  Nursing note and vitals reviewed.    Musculoskeletal Exam: C-spine thoracic lumbar spine good range of motion.  He has some limitation and discomfort range of motion of his lumbar spine.  Shoulder joints have discomfort with range of motion.  Elbows joints wrist joints are good range of motion with no synovitis.  He had no synovitis over MCP joints.  He has some DIP PIP thickening without any synovitis.  He has painful range of motion of bilateral hip joints.  He has painful range of motion of bilateral knee joints without any warmth swelling or effusion.  He has tenderness across his ankles and MTPs without any synovitis.  CDAI Exam: CDAI Homunculus Exam:   Tenderness:  Right hand: 2nd PIP, 3rd PIP, 4th PIP and 5th PIP Left hand: 2nd PIP, 3rd PIP, 4th PIP and 5th PIP RLE: acetabulofemoral, tibiofemoral and tibiotalar LLE: acetabulofemoral, tibiofemoral and tibiotalar  Joint Counts:  CDAI Tender Joint count: 10 CDAI Swollen Joint count: 0  Global Assessments:  Patient Global Assessment: 7 Provider Global Assessment: 7  CDAI Calculated Score: 24    Investigation: No additional findings. CBC Latest Ref Rng & Units 03/27/2017 03/10/2016 04/24/2014  WBC 4.0 - 10.5 K/uL 7.5 8.4 8.0  Hemoglobin 13.0 - 17.0 g/dL 15.8 15.0 15.9  Hematocrit 39.0 - 52.0 % 46.9 44.1 46.3  Platelets 150.0 - 400.0 K/uL 228.0 219.0 259   CMP Latest Ref Rng & Units 03/27/2017 03/10/2016 04/12/2014  Glucose 70 - 99 mg/dL  110(H) 102(H) -  BUN 6 - 23 mg/dL 14 17 -  Creatinine 0.40 - 1.50 mg/dL 0.93 0.90 -  Sodium 135 - 145 mEq/L 141 141 -  Potassium 3.5 - 5.1 mEq/L 4.2 4.0 -  Chloride 96 - 112 mEq/L 107 108 -  CO2 19 - 32 mEq/L 25 27 -  Calcium 8.4 - 10.5 mg/dL 9.8 9.4 -  Total Protein 6.0 - 8.3 g/dL 6.8 6.5 6.9  Total Bilirubin 0.2 - 1.2 mg/dL 0.5 0.4 0.8  Alkaline Phos 39 - 117 U/L 84 91 80  AST 0 - 37 U/L 17 19 18   ALT 0 - 53 U/L 15 18 14    Component     Latest Ref Rng & Units 05/29/2017  Anit Nuclear Antibody(ANA)     NEGATIVE NEGATIVE  RA Latex Turbid.     <14 IU/mL 35 (  H)  Uric Acid, Serum     4.0 - 7.8 mg/dL 5.9  Sed Rate     0 - 20 mm/hr 2   Imaging: Xr Hip Unilat W Or W/o Pelvis 2-3 Views Left  Result Date: 10/29/2017 No hip joint narrowing or chondrocalcinosis was noted.  No SI joint narrowing was noted. Impression: Unremarkable x-ray of the hip joint.  Xr Hip Unilat W Or W/o Pelvis 2-3 Views Right  Result Date: 10/29/2017 No hip joint narrowing or chondrocalcinosis was noted.  No SI joint narrowing was noted. Impression: Unremarkable x-ray of the hip joint.  Xr Foot 2 Views Left  Result Date: 10/29/2017 Mild first MTP narrowing was noted.  None of the other MTPs are involved.  Mild DIP narrowing and spurring was noted.  No intertarsal joint space narrowing was noted.  No subtalar joint space narrowing was noted. Impression: Mild osteoarthritis of the foot.  Xr Foot 2 Views Right  Result Date: 10/29/2017 Mild first MTP narrowing was noted.  None of the other MTPs are involved.  Mild DIP narrowing and spurring was noted.  No intertarsal joint space narrowing was noted.  No subtalar joint space narrowing was noted. Impression: Mild osteoarthritis of the foot.  Xr Hand 2 View Left  Result Date: 10/29/2017 All PIP and DIP narrowing is noted.  No significant MCP joint narrowing was noted.  No intercarpal radiocarpal joint space narrowing was noted.  CMC joint severe narrowing was noted.  Impression: These findings are consistent with osteoarthritis of the hand.  Xr Hand 2 View Right  Result Date: 10/29/2017 Mild first and second MCP joint narrowing, all PIP and DIP joint narrowing and CMC joint narrowing was noted.  No intercarpal radiocarpal joint space narrowing was noted.  No erosive changes were noted. Impression: These findings are consistent with osteoarthritis of the hand.  It also raises concerns of inflammatory arthritis which can be seen with hemachromatosis or rheumatoid arthritis.  Xr Knee 3 View Left  Result Date: 10/29/2017 Mild medial compartment narrowing with medial osteophyte and intercondylar osteophyte was noted.  Mild patellofemoral narrowing was noted.  There was no evidence of chondrocalcinosis. Impression: These findings are consistent with mild osteoarthritis and mild chondromalacia patella.  Xr Knee 3 View Right  Result Date: 10/29/2017 No significant medial lateral compartment narrowing was noted.  No chondrocalcinosis was noted.  Moderate patellofemoral narrowing was noted. 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   Speciality Comments: No specialty comments available.    Procedures:  No procedures performed Allergies: Patient has no known allergies.   Assessment / Plan:     Visit Diagnoses: Pain in both hands - +RF patient gives history of severe pain and discomfort in his bilateral hands with decreased grip strength.  He also gives history of swelling in his hands intermittently.  He states the swelling has gone down since he has been taking Mobic. It is not unusual to see positive rheumatoid factor and hepatitis C positive patients.  I would like to schedule ultrasound of his bilateral hands when he is off Mobic to look for synovitis..- Plan: XR Hand 2 View Right, XR Hand 2 View Left, Cyclic citrul peptide antibody, IgG, 14-3-3 eta Protein  Chronic pain of both hips -he had fairly good range of motion of his hip  joints with discomfort.  Plan: XR HIP UNILAT W OR W/O PELVIS 2-3 VIEWS RIGHT, XR HIP UNILAT W OR W/O PELVIS 2-3 VIEWS LEFT  Chronic pain of both knees -he had  painful range of motion of his knee joints without any warmth swelling or effusion.  Plan: XR KNEE 3 VIEW RIGHT, XR KNEE 3 VIEW LEFT  Pain in both feet -he had tenderness across his feet without synovitis.  Plan: XR Foot 2 Views Right, XR Foot 2 Views Left  History of hepatitis C: Patient reports he was treated for hepatitis C.  His hep C quant was negative and 2018.  History of colon polyps  History of BPH  Other fatigue - Plan: CK, TSH, Hepatitis B core antibody, IgM, Hepatitis B surface antigen, Serum protein electrophoresis with reflex, Glucose 6 phosphate dehydrogenase    Orders: Orders Placed This Encounter  Procedures  . XR Hand 2 View Right  . XR Hand 2 View Left  . XR KNEE 3 VIEW RIGHT  . XR KNEE 3 VIEW LEFT  . XR HIP UNILAT W OR W/O PELVIS 2-3 VIEWS RIGHT  . XR HIP UNILAT W OR W/O PELVIS 2-3 VIEWS LEFT  . XR Foot 2 Views Right  . XR Foot 2 Views Left  . CK  . TSH  . Cyclic citrul peptide antibody, IgG  . 14-3-3 eta Protein  . Hepatitis B core antibody, IgM  . Hepatitis B surface antigen  . Serum protein electrophoresis with reflex  . Glucose 6 phosphate dehydrogenase   No orders of the defined types were placed in this encounter.   Face-to-face time spent with patient was 50 minutes.  Greater than 50% of time was spent in counseling and coordination of care.  Follow-Up Instructions: No Follow-up on file.   Bo Merino, MD  Note - This record has been created using Editor, commissioning.  Chart creation errors have been sought, but may not always  have been located. Such creation errors do not reflect on  the standard of medical care.

## 2017-10-16 ENCOUNTER — Encounter: Payer: Self-pay | Admitting: Family

## 2017-10-16 MED ORDER — MELOXICAM 7.5 MG PO TABS: 7.5000 mg | ORAL_TABLET | Freq: Every day | ORAL | 0 refills | Status: DC

## 2017-10-16 NOTE — Telephone Encounter (Signed)
Cancelled Rx with Denton Ar at [facility] and resent to [facility]. Sent mychart message to pt.

## 2017-10-16 NOTE — Addendum Note (Signed)
Addended by: Kelle Darting A on: 10/16/2017 04:35 PM   Modules accepted: Orders

## 2017-10-29 ENCOUNTER — Ambulatory Visit (INDEPENDENT_AMBULATORY_CARE_PROVIDER_SITE_OTHER): Payer: Self-pay

## 2017-10-29 ENCOUNTER — Telehealth: Payer: Self-pay | Admitting: Rheumatology

## 2017-10-29 ENCOUNTER — Ambulatory Visit (INDEPENDENT_AMBULATORY_CARE_PROVIDER_SITE_OTHER): Payer: 59 | Admitting: Rheumatology

## 2017-10-29 ENCOUNTER — Encounter: Payer: Self-pay | Admitting: Rheumatology

## 2017-10-29 VITALS — BP 155/81 | HR 65 | Resp 16 | Ht 70.0 in | Wt 210.0 lb

## 2017-10-29 DIAGNOSIS — M79671 Pain in right foot: Secondary | ICD-10-CM

## 2017-10-29 DIAGNOSIS — M25551 Pain in right hip: Secondary | ICD-10-CM

## 2017-10-29 DIAGNOSIS — M25561 Pain in right knee: Secondary | ICD-10-CM | POA: Diagnosis not present

## 2017-10-29 DIAGNOSIS — M79672 Pain in left foot: Secondary | ICD-10-CM

## 2017-10-29 DIAGNOSIS — M79641 Pain in right hand: Secondary | ICD-10-CM | POA: Diagnosis not present

## 2017-10-29 DIAGNOSIS — R5383 Other fatigue: Secondary | ICD-10-CM

## 2017-10-29 DIAGNOSIS — Z8619 Personal history of other infectious and parasitic diseases: Secondary | ICD-10-CM | POA: Diagnosis not present

## 2017-10-29 DIAGNOSIS — Z87438 Personal history of other diseases of male genital organs: Secondary | ICD-10-CM

## 2017-10-29 DIAGNOSIS — M79642 Pain in left hand: Secondary | ICD-10-CM | POA: Diagnosis not present

## 2017-10-29 DIAGNOSIS — Z86010 Personal history of colonic polyps: Secondary | ICD-10-CM

## 2017-10-29 DIAGNOSIS — M25552 Pain in left hip: Secondary | ICD-10-CM

## 2017-10-29 DIAGNOSIS — G8929 Other chronic pain: Secondary | ICD-10-CM

## 2017-10-29 DIAGNOSIS — M25562 Pain in left knee: Secondary | ICD-10-CM

## 2017-10-29 NOTE — Telephone Encounter (Signed)
Patient stated that Dr. Estanislado Pandy wanted him to stop taking his Meloxicam and he couldn't remember when she said for him to stop.

## 2017-11-03 LAB — HEPATITIS B CORE ANTIBODY, IGM: HEP B C IGM: NONREACTIVE

## 2017-11-03 LAB — PROTEIN ELECTROPHORESIS, SERUM, WITH REFLEX
Albumin ELP: 4.5 g/dL (ref 3.8–4.8)
Alpha 1: 0.3 g/dL (ref 0.2–0.3)
Alpha 2: 0.9 g/dL (ref 0.5–0.9)
BETA 2: 0.3 g/dL (ref 0.2–0.5)
BETA GLOBULIN: 0.4 g/dL (ref 0.4–0.6)
Gamma Globulin: 0.8 g/dL (ref 0.8–1.7)
TOTAL PROTEIN: 7.2 g/dL (ref 6.1–8.1)

## 2017-11-03 LAB — CK: CK TOTAL: 147 U/L (ref 44–196)

## 2017-11-03 LAB — HEPATITIS B SURFACE ANTIGEN: HEP B S AG: NONREACTIVE

## 2017-11-03 LAB — 14-3-3 ETA PROTEIN

## 2017-11-03 LAB — TSH: TSH: 2.06 m[IU]/L (ref 0.40–4.50)

## 2017-11-03 LAB — CYCLIC CITRUL PEPTIDE ANTIBODY, IGG

## 2017-11-03 LAB — GLUCOSE 6 PHOSPHATE DEHYDROGENASE: G-6PDH: 12.9 U/g Hgb (ref 7.0–20.5)

## 2017-11-03 NOTE — Progress Notes (Signed)
Awaiting [place] bilateral hands

## 2017-11-12 ENCOUNTER — Other Ambulatory Visit: Payer: Self-pay | Admitting: Family

## 2017-11-12 ENCOUNTER — Encounter: Payer: Self-pay | Admitting: Rheumatology

## 2017-11-12 NOTE — Telephone Encounter (Signed)
He may ask his PCP to prescribe the meloxicam for right now.  We will discuss other treatment options at follow-up visit.  He also needs ultrasound of bilateral hands.  Please schedule that.

## 2017-11-13 ENCOUNTER — Other Ambulatory Visit: Payer: Self-pay | Admitting: Family

## 2017-11-13 MED ORDER — MELOXICAM 7.5 MG PO TABS: 7.5000 mg | ORAL_TABLET | Freq: Every day | ORAL | 0 refills | Status: DC

## 2017-11-13 NOTE — Progress Notes (Signed)
Office Visit Note  Patient: William Neal             Date of Birth: August 09, 1956           MRN: [id number]             PCP: Debbrah Alar, NP Referring: Debbrah Alar, NP Visit Date: 11/26/2017 Occupation: @GUAROCC @    Subjective:  Pain in bilateral hands.   History of Present Illness: William Neal is a 62 y.o. male with history of osteoarthritis involving multiple joints.  He also has pain and swelling in his hands.  He states his been taking Mobic which she is to help him before but is not very effective now.  He continues to have discomfort in his bilateral hands, bilateral knee joints and bilateral feet.  Activities of Daily Living:  Patient reports morning stiffness for 30-45 minutes.   Patient Denies nocturnal pain.  Difficulty dressing/grooming: Denies Difficulty climbing stairs: Reports Difficulty getting out of chair: Reports Difficulty using hands for taps, buttons, cutlery, and/or writing: Reports   Review of Systems  Constitutional: Negative for fatigue and night sweats.  HENT: Negative for mouth sores, mouth dryness and nose dryness.   Eyes: Negative for pain, redness and dryness.  Respiratory: Negative for shortness of breath and difficulty breathing.        With activity   Cardiovascular: Negative for chest pain, palpitations, hypertension, irregular heartbeat and swelling in legs/feet.  Gastrointestinal: Negative for abdominal pain, constipation and diarrhea.  Endocrine: Negative for increased urination.  Genitourinary: Negative for pelvic pain.  Musculoskeletal: Positive for arthralgias, joint pain, joint swelling and morning stiffness. Negative for myalgias, muscle weakness, muscle tenderness and myalgias.  Skin: Negative for color change, rash, hair loss, nodules/bumps, skin tightness, ulcers and sensitivity to sunlight.  Allergic/Immunologic: Negative for susceptible to infections.  Neurological: Negative for dizziness, fainting, headaches,  memory loss, night sweats and weakness.  Hematological: Negative for bruising/bleeding tendency and swollen glands.  Psychiatric/Behavioral: Negative for depressed mood, confusion and sleep disturbance. The patient is not nervous/anxious.     PMFS History:  Patient Active Problem List   Diagnosis Date Noted  . Other malaise and fatigue 04/24/2014  . Skin cyst 04/24/2014  . Routine general medical examination at a health care facility 10/12/2012  . History of hepatitis C 10/12/2012  . History of colon polyps 10/12/2012  . BPH (benign prostatic hyperplasia) 10/12/2012    Past Medical History:  Diagnosis Date  . BPH (benign prostatic hyperplasia) 10/12/2012  . History of hepatitis C     Family History  Problem Relation Age of Onset  . Diabetes Mother   . Emphysema Father   . Hypertension Maternal Uncle   . Coronary artery disease Brother        multiple stents, 1/2 brother  . Heart disease Unknown 80       died in his sleep, ?presumed heart attack  . Hypertension Sister   . Colon cancer Neg Hx   . Esophageal cancer Neg Hx   . Rectal cancer Neg Hx   . Stomach cancer Neg Hx    Past Surgical History:  Procedure Laterality Date  . 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 He appears well-developed and well-nourished.  HENT:  Head: Normocephalic and atraumatic.  Eyes: Pupils are equal, round, and reactive to light. Conjunctivae and EOM are normal.  Neck: Normal range of motion. Neck supple.  Cardiovascular: Normal rate, regular rhythm and normal heart  sounds.  Pulmonary/Chest: Effort normal and breath sounds normal.  Abdominal: Soft. Bowel sounds are normal.  Neurological: He is alert and oriented to person, place, and time.  Skin: Skin is warm and dry. Capillary refill takes less than 2 seconds.  Psychiatric: He has a normal mood and affect. His behavior is normal.  Nursing note and vitals reviewed.    Musculoskeletal Exam: C-spine thoracic lumbar spine good range of motion.  Shoulder joints elbow joints were in good range of motion.  He has DIP PIP thickening in his hands.  He also has mild thickening over MCP joints but no synovitis was noted.  Hip joints knee joints ankles MTPs PIPs were in good range of motion.  He has some discomfort range of motion of bilateral knee joints without any warmth swelling or effusion.  CDAI Exam: No CDAI exam completed.    Investigation: No additional findings. 11/26/2017 SPEP negative, CK normal, TSH normal, hepatitis B negative, hepatitis C negative, G6PD normal, anti-CCP negative, 14 3 3  eta negative  Imaging: [place] Extrem Up Bilat Comp  Result Date: 11/26/2017 Ultrasound examination of bilateral hands was performed per EULAR recommendations. Using 12 MHz transducer, grayscale and power Doppler bilateral second, third, and fifth MCP joints and bilateral wrist joints both dorsal and volar aspects were evaluated to look for synovitis or tenosynovitis. The findings were there was mild synovitis in left second and bilateral third MCPs and bilateral wrist joints  on ultrasound examination. Right median nerve was 0.14 cm squares which was in the upper limits of normal and left median nerve was 0.13 cm squares which was within normal limits. Impression: Ultrasound examination these findings are consistent with inflammatory arthritis.  The median nerves are within normal limits.  Xr Hip Unilat W Or W/o Pelvis 2-3 Views Left  Result Date: 10/29/2017 No hip joint narrowing or chondrocalcinosis was noted.  No SI  joint narrowing was noted. Impression: Unremarkable x-ray of the hip joint.  Xr Hip Unilat W Or W/o Pelvis 2-3 Views Right  Result Date: 10/29/2017 No hip joint narrowing or chondrocalcinosis was noted.  No SI joint narrowing was noted. Impression: Unremarkable x-ray of the hip joint.  Xr Foot 2 Views Left  Result Date: 10/29/2017 Mild first MTP narrowing was noted.  None of the other MTPs are involved.  Mild DIP narrowing and spurring was noted.  No intertarsal joint space narrowing was noted.  No subtalar joint space narrowing was noted. Impression: Mild osteoarthritis of the foot.  Xr Foot 2 Views Right  Result Date: 10/29/2017 Mild first MTP narrowing was noted.  None of the other MTPs are involved.  Mild DIP narrowing and spurring was noted.  No intertarsal joint space narrowing was noted.  No subtalar joint space narrowing was noted. Impression: Mild osteoarthritis of the foot.  Xr Hand 2 View Left  Result Date: 10/29/2017 All PIP and DIP narrowing is noted.  No significant MCP joint narrowing was noted.  No intercarpal radiocarpal joint  space narrowing was noted.  CMC joint severe narrowing was noted. Impression: These findings are consistent with osteoarthritis of the hand.  Xr Hand 2 View Right  Result Date: 10/29/2017 Mild first and second MCP joint narrowing, all PIP and DIP joint narrowing and CMC joint narrowing was noted.  No intercarpal radiocarpal joint space narrowing was noted.  No erosive changes were noted. Impression: These findings are consistent with osteoarthritis of the hand.  It also raises concerns of inflammatory arthritis which can be seen with hemachromatosis or rheumatoid arthritis.  Xr Knee 3 View Left  Result Date: 10/29/2017 Mild medial compartment narrowing with medial osteophyte and intercondylar osteophyte was noted.  Mild patellofemoral narrowing was noted.  There was no evidence of chondrocalcinosis. Impression: These findings are consistent with mild  osteoarthritis and mild chondromalacia patella.  Xr Knee 3 View Right  Result Date: 10/29/2017 No significant medial lateral compartment narrowing was noted.  No chondrocalcinosis was noted.  Moderate patellofemoral narrowing was noted. 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   Speciality Comments: No specialty comments available.    Procedures:  No procedures performed Allergies: Patient has no known allergies.   Assessment / Plan:     Visit Diagnoses: Rheumatoid arthritis with rheumatoid factor of multiple sites without organ or systems involvement (HCC) - RF 35, anti-CCP negative, 14 338 and negative, ESR normal.  Plan ultrasound bilateral hands.  X-ray of bilateral hands showed second and third MCP narrowing. - Plan: [place] Extrem Up Bilat Comp.  The ultrasound of bilateral hands performed today showed mild synovitis.  Patient continues to have intermittent increased swelling.  We had detailed discussion regarding rheumatoid arthritis.  Different treatment options and their side effects were discussed.  As his disease is mild he might respond well to hydroxychloroquine.  Patient was in agreement to proceed with the medication.  Handout was given and consent was taken.  The plan is to start him on hydroxychloroquine 200 mg p.o. twice daily.  We will check labs in a month and every 3 months to monitor for drug toxicity.  He will also have baseline eye exam and then yearly eye exam.  Patient was counseled on the purpose, proper use, and adverse effects of hydroxychloroquine including nausea/diarrhea, skin rash, headaches, and sun sensitivity.  Discussed importance of annual eye exams while on hydroxychloroquine to monitor to ocular toxicity and discussed importance of frequent laboratory monitoring.  Provided patient with eye exam form for baseline ophthalmologic exam.  Provided patient with educational materials on hydroxychloroquine and answered all questions.  Patient  consented to hydroxychloroquine.  Will upload consent in the media tab.      High risk medication use - Plan: CBC with Differential/Platelet, COMPLETE METABOLIC PANEL WITH GFR in 1 month and every 3 months.  Primary osteoarthritis of both hands when he has severe osteoarthritis in his hands which causes pain and stiffness as well.  He has incomplete fist formation to do that.  Primary osteoarthritis of left knee - Mild osteoarthritis and mild chondromalacia patella.  He continues to have discomfort in his knee joints.  Primary osteoarthritis of both feet: Chronic pain, proper fitting shoes were discussed.  History of hepatitis C - ttd. Repeat Hep C negative.  History of colon polyps    Orders: Orders Placed This Encounter  Procedures  . [place] Extrem Up Bilat Comp  . CBC with Differential/Platelet  . COMPLETE METABOLIC PANEL WITH GFR   Meds ordered this encounter  Medications  . hydroxychloroquine (PLAQUENIL) 200  MG tablet    Sig: Take 227m by mouth twice daily.    Dispense:  60 tablet    Refill:  0    Face-to-face time spent with patient was 30 minutes.  Greater than 50% of time was spent in counseling and coordination of care.  Follow-Up Instructions: Return in about 3 months (around 02/26/2018) for Rheumatoid arthritis.   SBo Merino MD  Note - This record has been created using DEditor, commissioning  Chart creation errors have been sought, but may not always  have been located. Such creation errors do not reflect on  the standard of medical care.

## 2017-11-26 ENCOUNTER — Encounter: Payer: Self-pay | Admitting: Rheumatology

## 2017-11-26 ENCOUNTER — Ambulatory Visit (INDEPENDENT_AMBULATORY_CARE_PROVIDER_SITE_OTHER): Payer: Self-pay

## 2017-11-26 ENCOUNTER — Ambulatory Visit (INDEPENDENT_AMBULATORY_CARE_PROVIDER_SITE_OTHER): Payer: 59 | Admitting: Rheumatology

## 2017-11-26 VITALS — BP 148/88 | HR 58 | Resp 16 | Ht 71.0 in | Wt 212.0 lb

## 2017-11-26 DIAGNOSIS — M19041 Primary osteoarthritis, right hand: Secondary | ICD-10-CM

## 2017-11-26 DIAGNOSIS — Z8619 Personal history of other infectious and parasitic diseases: Secondary | ICD-10-CM

## 2017-11-26 DIAGNOSIS — M79642 Pain in left hand: Secondary | ICD-10-CM

## 2017-11-26 DIAGNOSIS — M19071 Primary osteoarthritis, right ankle and foot: Secondary | ICD-10-CM

## 2017-11-26 DIAGNOSIS — M79641 Pain in right hand: Secondary | ICD-10-CM | POA: Diagnosis not present

## 2017-11-26 DIAGNOSIS — M0579 Rheumatoid arthritis with rheumatoid factor of multiple sites without organ or systems involvement: Secondary | ICD-10-CM | POA: Diagnosis not present

## 2017-11-26 DIAGNOSIS — M19072 Primary osteoarthritis, left ankle and foot: Secondary | ICD-10-CM

## 2017-11-26 DIAGNOSIS — Z79899 Other long term (current) drug therapy: Secondary | ICD-10-CM | POA: Diagnosis not present

## 2017-11-26 DIAGNOSIS — M1712 Unilateral primary osteoarthritis, left knee: Secondary | ICD-10-CM | POA: Diagnosis not present

## 2017-11-26 DIAGNOSIS — M19042 Primary osteoarthritis, left hand: Secondary | ICD-10-CM | POA: Diagnosis not present

## 2017-11-26 DIAGNOSIS — Z86010 Personal history of colonic polyps: Secondary | ICD-10-CM

## 2017-11-26 MED ORDER — HYDROXYCHLOROQUINE SULFATE 200 MG PO TABS: ORAL_TABLET | ORAL | 0 refills | Status: DC

## 2017-11-26 NOTE — Patient Instructions (Addendum)
Standing Labs We placed an order today for your standing lab work.    Please come back and get your standing labs in 1 month after starting plaquenil, 3 months after starting and then every 5 months.   We have open lab Monday through Friday from 8:30-11:30 AM and 1:30-4:00 PM  at the office of Dr. Bo Merino.   You may experience shorter wait times on Monday and Friday afternoons. The office is located at [address], [place], [place],  [address] No appointment is necessary.   Labs are drawn by [facility].  You may receive a bill from [facility] for your lab work. If you have any questions regarding directions or hours of operation,  please call [phone].      Hydroxychloroquine tablets What is this medicine? HYDROXYCHLOROQUINE (hye drox ee KLOR oh kwin) is used to treat rheumatoid arthritis and systemic lupus erythematosus. It is also used to treat malaria. This medicine may be used for other purposes; ask your health care provider or pharmacist if you have questions. COMMON BRAND NAME(S): Plaquenil, Quineprox What should I tell my health care provider before I take this medicine? They need to know if you have any of these conditions: -diabetes -eye disease, vision problems -G6PD deficiency -history of blood diseases -history of irregular heartbeat -if you often drink alcohol -kidney disease -liver disease -porphyria -psoriasis -seizures -an unusual or allergic reaction to chloroquine, hydroxychloroquine, other medicines, foods, dyes, or preservatives -pregnant or trying to get pregnant -breast-feeding How should I use this medicine? Take this medicine by mouth with a glass of water. Follow the directions on the prescription label. Avoid taking antacids within 4 hours of taking this medicine. It is best to separate these medicines by at least 4 hours. Do not cut, crush or chew this medicine. You can take it with or without food. If it upsets your stomach, take it  with food. Take your medicine at regular intervals. Do not take your medicine more often than directed. Take all of your medicine as directed even if you think you are better. Do not skip doses or stop your medicine early. Talk to your pediatrician regarding the use of this medicine in children. While this drug may be prescribed for selected conditions, precautions do apply. Overdosage: If you think you have taken too much of this medicine contact a poison control center or emergency room at once. NOTE: This medicine is only for you. Do not share this medicine with others. What if I miss a dose? If you miss a dose, take it as soon as you can. If it is almost time for your next dose, take only that dose. Do not take double or extra doses. What may interact with this medicine? Do not take this medicine with any of the following medications: -cisapride -dofetilide -dronedarone -live virus vaccines -penicillamine -pimozide -thioridazine -ziprasidone This medicine may also interact with the following medications: -ampicillin -antacids -cimetidine -cyclosporine -digoxin -medicines for diabetes, like insulin, glipizide, glyburide -medicines for seizures like carbamazepine, phenobarbital, phenytoin -mefloquine -methotrexate -other medicines that prolong the QT interval (cause an abnormal heart rhythm) -praziquantel This list may not describe all possible interactions. Give your health care provider a list of all the medicines, herbs, non-prescription drugs, or dietary supplements you use. Also tell them if you smoke, drink alcohol, or use illegal drugs. Some items may interact with your medicine. What should I watch for while using this medicine? Tell your doctor or healthcare professional if your symptoms do not start  to get better or if they get worse. Avoid taking antacids within 4 hours of taking this medicine. It is best to separate these medicines by at least 4 hours. Tell your doctor  or health care professional right away if you have any change in your eyesight. Your vision and blood may be tested before and during use of this medicine. This medicine can make you more sensitive to the sun. Keep out of the sun. If you cannot avoid being in the sun, wear protective clothing and use sunscreen. Do not use sun lamps or tanning beds/booths. What side effects may I notice from receiving this medicine? Side effects that you should report to your doctor or health care professional as soon as possible: -allergic reactions like skin rash, itching or hives, swelling of the face, lips, or tongue -changes in vision -decreased hearing or ringing of the ears -redness, blistering, peeling or loosening of the skin, including inside the mouth -seizures -sensitivity to light -signs and symptoms of a dangerous change in heartbeat or heart rhythm like chest pain; dizziness; fast or irregular heartbeat; palpitations; feeling faint or lightheaded, falls; breathing problems -signs and symptoms of liver injury like dark yellow or brown urine; general ill feeling or flu-like symptoms; light-colored stools; loss of appetite; nausea; right upper belly pain; unusually weak or tired; yellowing of the eyes or skin -signs and symptoms of low blood sugar such as feeling anxious; confusion; dizziness; increased hunger; unusually weak or tired; sweating; shakiness; cold; irritable; headache; blurred vision; fast heartbeat; loss of consciousness -uncontrollable head, mouth, neck, arm, or leg movements Side effects that usually do not require medical attention (report to your doctor or health care professional if they continue or are bothersome): -anxious -diarrhea -dizziness -hair loss -headache -irritable -loss of appetite -nausea, vomiting -stomach pain This list may not describe all possible side effects. Call your doctor for medical advice about side effects. You may report side effects to FDA at  1-800-FDA-1088. Where should I keep my medicine? Keep out of the reach of children. In children, this medicine can cause overdose with small doses. Store at room temperature between 15 and 30 degrees C (59 and 86 degrees F). Protect from moisture and light. Throw away any unused medicine after the expiration date. NOTE: This sheet is a summary. It may not cover all possible information. If you have questions about this medicine, talk to your doctor, pharmacist, or health care provider.  2018 Elsevier/Gold Standard (2016-04-02 14:16:15)

## 2017-12-28 ENCOUNTER — Telehealth: Payer: Self-pay | Admitting: Rheumatology

## 2017-12-28 ENCOUNTER — Other Ambulatory Visit: Payer: Self-pay | Admitting: *Deleted

## 2017-12-28 DIAGNOSIS — Z79899 Other long term (current) drug therapy: Secondary | ICD-10-CM

## 2017-12-28 LAB — COMPLETE METABOLIC PANEL WITH GFR
AG Ratio: 1.8 (calc) (ref 1.0–2.5)
ALBUMIN MSPROF: 4.6 g/dL (ref 3.6–5.1)
ALKALINE PHOSPHATASE (APISO): 79 U/L (ref 40–115)
ALT: 13 U/L (ref 9–46)
AST: 16 U/L (ref 10–35)
BILIRUBIN TOTAL: 0.6 mg/dL (ref 0.2–1.2)
BUN: 13 mg/dL (ref 7–25)
CHLORIDE: 106 mmol/L (ref 98–110)
CO2: 24 mmol/L (ref 20–32)
Calcium: 9.6 mg/dL (ref 8.6–10.3)
Creat: 0.99 mg/dL (ref 0.70–1.33)
GFR, Est African American: 96 mL/min/{1.73_m2} (ref >=60)
GFR, Est Non African American: 83 mL/min/{1.73_m2} (ref >=60)
GLOBULIN: 2.5 g/dL (ref 1.9–3.7)
Glucose, Bld: 100 mg/dL — ABNORMAL HIGH (ref 65–99)
POTASSIUM: 4 mmol/L (ref 3.5–5.3)
SODIUM: 140 mmol/L (ref 135–146)
Total Protein: 7.1 g/dL (ref 6.1–8.1)

## 2017-12-28 LAB — CBC WITH DIFFERENTIAL/PLATELET
Basophils Absolute: 73 cells/uL (ref 0–200)
Basophils Relative: 1 %
EOS PCT: 2.3 %
Eosinophils Absolute: 168 cells/uL (ref 15–500)
HCT: 45.1 % (ref 38.5–50.0)
Hemoglobin: 15.9 g/dL (ref 13.2–17.1)
Lymphs Abs: 2051 cells/uL (ref 850–3900)
MCH: 32.3 pg (ref 27.0–33.0)
MCHC: 35.3 g/dL (ref 32.0–36.0)
MCV: 91.7 fL (ref 80.0–100.0)
MPV: 11.4 fL (ref 7.5–12.5)
Monocytes Relative: 12.2 %
NEUTROS PCT: 56.4 %
Neutro Abs: 4117 cells/uL (ref 1500–7800)
PLATELETS: 225 10*3/uL (ref 140–400)
RBC: 4.92 10*6/uL (ref 4.20–5.80)
RDW: 12.5 % (ref 11.0–15.0)
TOTAL LYMPHOCYTE: 28.1 %
WBC mixed population: 891 cells/uL (ref 200–950)
WBC: 7.3 10*3/uL (ref 3.8–10.8)

## 2017-12-28 NOTE — Telephone Encounter (Addendum)
Prescription has been sent to the pharmacy. 

## 2017-12-28 NOTE — Telephone Encounter (Addendum)
Patient here today for lab work. Also, needs a refill on his Plaquenil sent to [facility] in [place]. Patient also wants to check to see if Plaquenil Eye Exam form was faxed in to [place] from his doctor at [facility] in [place]. Please call to inform patient. Patient took last dose of Plaquenil this am, and will need dose for tonight.

## 2017-12-29 ENCOUNTER — Other Ambulatory Visit: Payer: Self-pay | Admitting: Rheumatology

## 2017-12-29 ENCOUNTER — Telehealth: Payer: Self-pay | Admitting: Rheumatology

## 2017-12-29 ENCOUNTER — Other Ambulatory Visit: Payer: Self-pay | Admitting: Family

## 2017-12-29 MED ORDER — MELOXICAM 7.5 MG PO TABS: 7.5000 mg | ORAL_TABLET | Freq: Every day | ORAL | 1 refills | Status: DC

## 2017-12-29 MED ORDER — HYDROXYCHLOROQUINE SULFATE 200 MG PO TABS: ORAL_TABLET | ORAL | 2 refills | Status: DC

## 2017-12-29 NOTE — Telephone Encounter (Signed)
Patient advised prescription has been sent to the pharmacy.  

## 2017-12-29 NOTE — Telephone Encounter (Signed)
Patient called stating that he has left two messages (one by phone and one in [place]) regarding his Plaquenil refill, as well as finding out if Dr. Estanislado Pandy received his eye exam from [facility] in [place].  Patient states he has already missed two doses and is requesting a return call.

## 2017-12-29 NOTE — Telephone Encounter (Signed)
Last visit: 10/29/17 Next visit: 02/25/18 Labs: 03/27/17 WNL PLQ Eye Exam: 12/21/17 WNL  Okay to refill per Dr. Estanislado Pandy

## 2017-12-30 ENCOUNTER — Other Ambulatory Visit: Payer: 59 | Admitting: Rheumatology

## 2018-02-19 NOTE — Progress Notes (Signed)
Office Visit Note  Patient: William Neal             Date of Birth: Sep 06, 1955           MRN: [id number]             PCP: Debbrah Alar, NP Referring: Debbrah Alar, NP Visit Date: 02/25/2018 Occupation: @GUAROCC @    Subjective:  Pain in both hands   History of Present Illness: [name] is a 62 y.o. male with history of seropositive rheumatoid arthritis and osteoarthritis.  Patient has been off of meloxicam for the past 2 weeks due to elevated blood pressure.  He continues take Plaquenil 200 mg twice daily.  He states that he has been flaring over the past 2 weeks.  He states he is having pain and swelling in bilateral hands.  He has been having difficulty making complete fist.  He states he is also having discomfort in his left knee joint.  He states his left knee feels very stiff.     Activities of Daily Living:  Patient reports morning stiffness for 30-60 minutes.   Patient Denies nocturnal pain.  Difficulty dressing/grooming: Denies Difficulty climbing stairs: Reports Difficulty getting out of chair: Reports Difficulty using hands for taps, buttons, cutlery, and/or writing: Reports   Review of Systems  Constitutional: Positive for fatigue. Negative for fever and night sweats.  HENT: Negative for ear pain, mouth sores, mouth dryness and nose dryness.   Eyes: Negative for pain, redness and dryness.  Respiratory: Negative for cough and difficulty breathing.   Cardiovascular: Positive for swelling in legs/feet. Negative for chest pain, palpitations, hypertension and irregular heartbeat.  Gastrointestinal: Positive for diarrhea. Negative for constipation.  Endocrine: Negative for increased urination.  Genitourinary: Negative for difficulty urinating and painful urination.  Musculoskeletal: Positive for arthralgias, joint pain, joint swelling and morning stiffness. Negative for myalgias, muscle weakness, muscle tenderness and myalgias.  Skin: Positive for  sensitivity to sunlight. Negative for color change, rash, hair loss, nodules/bumps, skin tightness and ulcers.  Allergic/Immunologic: Negative for susceptible to infections.  Neurological: Negative for dizziness, fainting, numbness, memory loss, night sweats and weakness.  Hematological: Negative for bruising/bleeding tendency and swollen glands.  Psychiatric/Behavioral: Negative for depressed mood and sleep disturbance. The patient is not nervous/anxious.     PMFS History:  Patient Active Problem List   Diagnosis Date Noted  . Other malaise and fatigue 04/24/2014  . Skin cyst 04/24/2014  . Routine general medical examination at a health care facility 10/12/2012  . History of hepatitis C 10/12/2012  . History of colon polyps 10/12/2012  . BPH (benign prostatic hyperplasia) 10/12/2012    Past Medical History:  Diagnosis Date  . BPH (benign prostatic hyperplasia) 10/12/2012  . History of hepatitis C     Family History  Problem Relation Age of Onset  . Diabetes Mother   . Emphysema Father   . Hypertension Maternal Uncle   . Coronary artery disease Brother        multiple stents, 1/2 brother  . Heart disease Unknown 80       died in his sleep, ?presumed heart attack  . Hypertension Sister   . Colon cancer Neg Hx   . Esophageal cancer Neg Hx   . Rectal cancer Neg Hx   . Stomach cancer Neg Hx    Past Surgical History:  Procedure Laterality Date  . 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 He appears well-developed and well-nourished.  HENT:  Head: Normocephalic and  atraumatic.  Eyes: Pupils are equal, round, and reactive to light. Conjunctivae and EOM are normal.  Neck: Normal range of motion. Neck supple.  Cardiovascular: Normal rate, regular rhythm and normal heart sounds.  Pulmonary/Chest: Effort normal and breath sounds normal.  Abdominal: Soft. Bowel sounds are normal.  Neurological: He is alert and oriented to person, place, and time.  Skin: Skin is warm and dry. Capillary refill takes less than 2 seconds.  Psychiatric: He has a normal mood and affect. His behavior is normal.  Nursing note and vitals reviewed.    Musculoskeletal Exam: C-spine, thoracic spine, and lumbar spine good ROM.  Shoulder joints, elbow joints, wrist joints, MCPs, PIPs, DIPs good ROM with tenderness.  He has synovitis of the left 2nd PIP joint. Difficulty with fist formation.  Hip joints, knee joints, ankle joints, MTPs, PIPs, and DIPs good ROM with no synovitis.  No warmth or effusion of knee joints.   CDAI Exam: CDAI Homunculus Exam:   Tenderness:  Right hand: 2nd MCP, 3rd MCP, 2nd PIP, 3rd PIP and 4th PIP Left hand: 1st MCP, 2nd MCP, 3rd MCP, 1st PIP and 2nd PIP  Swelling:  Left hand: 2nd PIP  Joint Counts:  CDAI Tender Joint count: 10 CDAI Swollen Joint count: 1     Investigation: No additional findings.PLQ eye exam: 12/21/2017 CBC Latest Ref Rng & Units 12/28/2017 03/27/2017 03/10/2016  WBC 3.8 - 10.8 Thousand/uL 7.3 7.5 8.4  Hemoglobin 13.2 - 17.1 g/dL 15.9 15.8 15.0  Hematocrit 38.5 - 50.0 % 45.1 46.9 44.1  Platelets 140 - 400 Thousand/uL 225 228.0 219.0   CMP Latest Ref Rng & Units 12/28/2017 10/29/2017 03/27/2017  Glucose 65 - 99 mg/dL 100(H) - 110(H)  BUN 7 - 25 mg/dL 13 - 14  Creatinine 0.70 - 1.33 mg/dL 0.99 - 0.93  Sodium 135 - 146 mmol/L 140 - 141  Potassium 3.5 - 5.3 mmol/L 4.0 - 4.2  Chloride 98 - 110 mmol/L 106 - 107  CO2 20 - 32 mmol/L 24 - 25  Calcium 8.6 - 10.3 mg/dL 9.6 - 9.8  Total Protein 6.1 - 8.1 g/dL 7.1 7.2 6.8  Total Bilirubin  0.2 - 1.2 mg/dL 0.6 - 0.5  Alkaline Phos 39 - 117 U/L - - 84  AST 10 - 35 U/L 16 - 17  ALT 9 - 46 U/L 13 - 15    Imaging: No results found.  Speciality Comments: PLQ Eye Exam: 12/21/17  WNL @ [facility] follow up in 6 months.     Procedures:  No procedures performed Allergies: Patient has no known allergies.      Assessment / Plan:     Visit Diagnoses: Rheumatoid arthritis with rheumatoid factor of multiple sites without organ or systems involvement (HCC) - RF 35, anti-CCP negative, 14 338 and negative, ESR normal. X-ray of bilateral hands showed second and third MCP narrowing: He has active synovitis on exam.  He has tenderness of multiple MCP and PIP joints.  He discontinued use of meloxicam 2 weeks ago which led to a flare of his rheumatoid arthritis.  He has been having joint pain and joint swelling in his bilateral hands and left knee.  He has no warmth or effusion of his left knee on exam today.  We will start him on a prednisone taper starting at 5 mg for 2 weeks then decreasing to 2.5 mg for 2 weeks.  He was advised to monitor his blood pressure closely while on prednisone.  We also discussed adding on sulfasalazine 500 mg 2 tablets twice daily to his current treatment regimen of Plaquenil 200 mg twice daily.  Indications, contraindications, and side effects of sulfasalazine were discussed.  All questions were addressed.  Consent was obtained.  A prescription for sulfasalazine and prednisone were sent to the pharmacy.  He will return for labs in 1 month and then every 3 months.  He is hesitant to try a biologic medication.  We briefly discussed if he fails these treatment options we will just try Enbrel or Humira in the future.  Medication counseling:  G6PD: 12.9 on 10/29/17  Patient was counseled on the purpose, proper use, and adverse effects of sulfasalazine including risk of infection and chance of nausea, headache, and sun sensitivity.  Also discussed risk of skin rash  and advised patient to stop the medication and let us know if she develops a rash. Also discussed for the potential of discoloration of the urine, sweat, or tears.  Reviewed the importance of frequent labs to monitor liver, kidneys, and blood counts; and provided patient with standing lab instructions.  Provided patient with educational materials on sulfasalazine and answered all questions.    High risk medication use - PLQ. eye exam: 12/21/2017.  CBC and CMP are within normal limits on 12/28/2017.  He will return in July and every 3 months for lab work.  Primary osteoarthritis of both hands: He has PIP and DIP synovial thickening consistent with osteoarthritis of bilateral hands.  He discontinued use of meloxicam 2 weeks ago which caused a flare of rheumatoid arthritis.  He has been having increased discomfort in bilateral hands.  Joint protection muscle strengthening were discussed.  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  No warmth or effusion was noted on exam.  He has noticed increased joint stiffness.  He was be started on a prednisone taper 25 mg which she will take for 2 weeks and then 2.5 mg for 2 weeks.  Primary osteoarthritis of both feet: He has PIP and DIP synovial thickening consistent with osteoarthritis.  He has no discomfort in his feet at this time.  He wears proper fitting shoes.   Other medical conditions are listed as follows:   History of colon polyps  History of hepatitis C - ttd. Repeat Hep C negative.    Orders: No orders of the defined types were placed in this encounter.  Meds ordered this encounter  Medications  . sulfaSALAzine (AZULFIDINE) 500 MG tablet    Sig: Take 2 tablets (1,000 mg total) by mouth 2 (two) times daily.    Dispense:  120 tablet    Refill:  2  . predniSONE (DELTASONE) 5 MG tablet    Sig: Take 1 tablet by mouth  for 2 weeks then take 1/2 tablet by mouth for 2 weeks.    Dispense:  21 tablet  Refill:  0    Face-to-face time spent with patient was 30 minutes. Greater than 50% of time was spent in counseling and coordination of care.  Follow-Up Instructions: Return in about 3 months (around 05/28/2018) for Rheumatoid arthritis, Osteoarthritis.   Ofilia Neas, PA-C I examined and evaluated the patient with Hazel Sams PA.  Patient had some synovitis on examination today.  He continues to have a lot of pain and discomfort in his joints.  We had detailed discussion regarding different treatment options.  He does not want any aggressive therapy.  After reviewing indications side effects contraindications of different medications he wanted to proceed with sulfasalazine.  We will try combination of Plaquenil and sulfasalazine for now.  The plan of care was discussed as noted above.  Bo Merino, MD Note - This record has been created using Editor, commissioning.  Chart creation errors have been sought, but may not always  have been located. Such creation errors do not reflect on  the standard of medical care.

## 2018-02-25 ENCOUNTER — Encounter: Payer: Self-pay | Admitting: Rheumatology

## 2018-02-25 ENCOUNTER — Ambulatory Visit (INDEPENDENT_AMBULATORY_CARE_PROVIDER_SITE_OTHER): Payer: 59 | Admitting: Rheumatology

## 2018-02-25 VITALS — BP 125/78 | HR 53 | Ht 70.0 in | Wt 204.0 lb

## 2018-02-25 DIAGNOSIS — Z79899 Other long term (current) drug therapy: Secondary | ICD-10-CM | POA: Diagnosis not present

## 2018-02-25 DIAGNOSIS — M0579 Rheumatoid arthritis with rheumatoid factor of multiple sites without organ or systems involvement: Secondary | ICD-10-CM | POA: Diagnosis not present

## 2018-02-25 DIAGNOSIS — Z8619 Personal history of other infectious and parasitic diseases: Secondary | ICD-10-CM | POA: Diagnosis not present

## 2018-02-25 DIAGNOSIS — M19042 Primary osteoarthritis, left hand: Secondary | ICD-10-CM | POA: Diagnosis not present

## 2018-02-25 DIAGNOSIS — M19071 Primary osteoarthritis, right ankle and foot: Secondary | ICD-10-CM

## 2018-02-25 DIAGNOSIS — M19072 Primary osteoarthritis, left ankle and foot: Secondary | ICD-10-CM | POA: Diagnosis not present

## 2018-02-25 DIAGNOSIS — M1712 Unilateral primary osteoarthritis, left knee: Secondary | ICD-10-CM | POA: Diagnosis not present

## 2018-02-25 DIAGNOSIS — Z86010 Personal history of colonic polyps: Secondary | ICD-10-CM

## 2018-02-25 DIAGNOSIS — M19041 Primary osteoarthritis, right hand: Secondary | ICD-10-CM | POA: Diagnosis not present

## 2018-02-25 MED ORDER — SULFASALAZINE 500 MG PO TABS: 1000.0000 mg | ORAL_TABLET | Freq: Two times a day (BID) | ORAL | 2 refills | Status: DC

## 2018-02-25 MED ORDER — PREDNISONE 5 MG PO TABS: ORAL_TABLET | ORAL | 0 refills | Status: DC

## 2018-02-25 NOTE — Patient Instructions (Addendum)
Standing Labs We placed an order today for your standing lab work.    Please come back and get your standing labs in 1 month and every 3 months  We have open lab Monday through Friday from 8:30-11:30 AM and 1:30-4:00 PM  at the office of Dr. Bo Merino.   You may experience shorter wait times on Monday and Friday afternoons. The office is located at [address] [place], [place], [place] [address] No appointment is necessary.   Labs are drawn by [facility].  You may receive a bill from [facility] for your lab work. If you have any questions regarding directions or hours of operation,  please call [phone].    Sulfasalazine tablets What is this medicine? SULFASALAZINE (sul fa SAL a zeen) is used to treat ulcerative colitis. This medicine may be used for other purposes; ask your health care provider or pharmacist if you have questions. COMMON BRAND NAME(S): Azulfidine, Sulfazine What should I tell my health care provider before I take this medicine? They need to know if you have any of these conditions: -asthma -blood disorders or anemia -glucose-6-phosphate dehydrogenase (G6PD) deficiency -intestinal obstruction -kidney disease -liver disease -porphyria -urinary tract obstruction -an unusual reaction to sulfasalazine, sulfa drugs, salicylates, or other medicines, foods, dyes, or preservatives -pregnant or trying to get pregnant -breast-feeding How should I use this medicine? Take this medicine by mouth with a full glass of water. Follow the directions on the prescription label. If the medicine upsets your stomach, take it with food or milk. Take your medicine at regular intervals. Do not take your medicine more often than directed. Do not stop taking except on your doctor's advice. Talk to your pediatrician regarding the use of this medicine in children. While this drug may be prescribed for children as young as 6 years for selected conditions, precautions do apply. Patients  over 60 years old may have a stronger reaction and need a smaller dose. Overdosage: If you think you have taken too much of this medicine contact a poison control center or emergency room at once. NOTE: This medicine is only for you. Do not share this medicine with others. What if I miss a dose? If you miss a dose, take it as soon as you can. If it is almost time for your next dose, take only that dose. Do not take double or extra doses. What may interact with this medicine? -digoxin -folic acid This list may not describe all possible interactions. Give your health care provider a list of all the medicines, herbs, non-prescription drugs, or dietary supplements you use. Also tell them if you smoke, drink alcohol, or use illegal drugs. Some items may interact with your medicine. What should I watch for while using this medicine? Visit your doctor or health care professional for regular checks on your progress. You will need frequent blood and urine checks. This medicine can make you more sensitive to the sun. Keep out of the sun. If you cannot avoid being in the sun, wear protective clothing and use sunscreen. Do not use sun lamps or tanning beds/booths. Drink plenty of water while taking this medicine. What side effects may I notice from receiving this medicine? Side effects that you should report to your doctor or health care professional as soon as possible: -allergic reactions like skin rash, itching or hives, swelling of the face, lips, or tongue -fever, chills, or any other sign of infection -painful, difficult, or reduced urination -redness, blistering, peeling or loosening of the skin,  including inside the mouth -severe stomach pain -unusual bleeding or bruising -unusually weak or tired -yellowing of the skin or eyes Side effects that usually do not require medical attention (report to your doctor or health care professional if they continue or are bothersome): -headache -loss of  appetite -nausea, vomiting -orange color to the urine -reduced sperm count This list may not describe all possible side effects. Call your doctor for medical advice about side effects. You may report side effects to FDA at 1-800-FDA-1088. Where should I keep my medicine? Keep out of the reach of children. Store at room temperature between 15 and 30 degrees C (59 and 86 degrees F). Keep container tightly closed. Throw away any unused medicine after the expiration date. NOTE: This sheet is a summary. It may not cover all possible information. If you have questions about this medicine, talk to your doctor, pharmacist, or health care provider.  2018 Elsevier/Gold Standard (2008-04-19 11:38:15)

## 2018-05-18 NOTE — Progress Notes (Deleted)
Office Visit Note  Patient: William Neal             Date of Birth: March 20, 1956           MRN: [id number]             PCP: Debbrah Alar, NP Referring: Debbrah Alar, NP Visit Date: 06/01/2018 Occupation: @GUAROCC @  Subjective:  No chief complaint on file.   History of Present Illness: Joanthan Hlavacek is a 62 y.o. male ***   Activities of Daily Living:  Patient reports morning stiffness for *** {minute/hour:19697}.   Patient {ACTIONS;DENIES/REPORTS:21021675::"Denies"} nocturnal pain.  Difficulty dressing/grooming: {ACTIONS;DENIES/REPORTS:21021675::"Denies"} Difficulty climbing stairs: {ACTIONS;DENIES/REPORTS:21021675::"Denies"} Difficulty getting out of chair: {ACTIONS;DENIES/REPORTS:21021675::"Denies"} Difficulty using hands for taps, buttons, cutlery, and/or writing: {ACTIONS;DENIES/REPORTS:21021675::"Denies"}  No Rheumatology ROS completed.   PMFS History:  Patient Active Problem List   Diagnosis Date Noted  . Other malaise and fatigue 04/24/2014  . Skin cyst 04/24/2014  . Routine general medical examination at a health care facility 10/12/2012  . History of hepatitis C 10/12/2012  . History of colon polyps 10/12/2012  . BPH (benign prostatic hyperplasia) 10/12/2012    Past Medical History:  Diagnosis Date  . BPH (benign prostatic hyperplasia) 10/12/2012  . History of hepatitis C     Family History  Problem Relation Age of Onset  . Diabetes Mother   . Emphysema Father   . Hypertension Maternal Uncle   . Coronary artery disease Brother        multiple stents, 1/2 brother  . Heart disease Unknown 80       died in his sleep, ?presumed heart attack  . Hypertension Sister   . Colon cancer Neg Hx   . Esophageal cancer Neg Hx   . Rectal cancer Neg Hx   . Stomach cancer Neg Hx    Past Surgical History:  Procedure Laterality Date  . 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   Physical Exam   Musculoskeletal Exam: ***  CDAI Exam: CDAI Score: Not documented Patient Global Assessment: Not documented; Provider Global Assessment: Not documented Swollen: Not documented; Tender: Not documented Joint Exam   Not documented   There is currently no information documented on the homunculus. Go to the Rheumatology activity and complete the homunculus joint exam.  Investigation: No additional findings.  Imaging: No results found.  Recent Labs: Lab Results  Component Value Date   WBC 7.3 12/28/2017   HGB 15.9 12/28/2017   PLT 225 12/28/2017   NA 140 12/28/2017   K 4.0 12/28/2017   CL 106 12/28/2017   CO2 24 12/28/2017   GLUCOSE 100 (H) 12/28/2017   BUN 13 12/28/2017   CREATININE 0.99 12/28/2017   BILITOT 0.6 12/28/2017   ALKPHOS 84 03/27/2017   AST 16 12/28/2017   ALT 13 12/28/2017   PROT 7.1 12/28/2017   ALBUMIN 4.4 03/27/2017   CALCIUM 9.6 12/28/2017   GFRAA 96 12/28/2017    Speciality Comments: PLQ Eye Exam: 12/21/17  WNL @ [facility] follow up in 6 months.   Procedures:  No procedures performed Allergies: Patient has no known allergies.   Assessment / Plan:     Visit Diagnoses: No diagnosis found.   Orders: No orders of the  defined types were placed in this encounter.  No orders of the defined types were placed in this encounter.   Face-to-face time spent with patient was *** minutes. Greater than 50% of time was spent in counseling and coordination of care.  Follow-Up Instructions: No follow-ups on file.   Earnestine Mealing, CMA  Note - This record has been created using Editor, commissioning.  Chart creation errors have been sought, but may not always  have been located. Such creation errors do not reflect on  the standard of medical care.

## 2018-05-31 ENCOUNTER — Telehealth: Payer: Self-pay | Admitting: Rheumatology

## 2018-05-31 NOTE — Telephone Encounter (Signed)
Patient states he stopped taking Plaquenil and Sulfasalazine. Patient states he did not like the way he felt. Patient states he was having nausea. Patient states once he stopped taking the medication he has felt better. Patient states he stopped taking the medication in July 2019. Patient states he cancelled the appointment for tomorrow because he is no longer on the medication. Patient states he is still having pain in hands and his knees. Patient states he is considering looking to more natural options that may not have as many side effects. Patient advised that there are other options and he may schedule an appointment to discuss. Patient states he currently has an upper respiratory infection and will call the office to schedule once he has cleared the infection.

## 2018-05-31 NOTE — Telephone Encounter (Signed)
Patient called stating he stopped taking all his medication in July that Dr. Estanislado Pandy prescribed because it was making him nauseated all the time and had no energy.  Patient states that since being off the medication he still has pain in his hands, but overall feels better.

## 2018-06-01 ENCOUNTER — Ambulatory Visit: Payer: 59 | Admitting: Rheumatology

## 2018-12-06 ENCOUNTER — Other Ambulatory Visit: Payer: Self-pay | Admitting: Rheumatology

## 2018-12-06 ENCOUNTER — Telehealth: Payer: 59 | Admitting: Family

## 2018-12-06 DIAGNOSIS — K0889 Other specified disorders of teeth and supporting structures: Secondary | ICD-10-CM

## 2018-12-06 NOTE — Progress Notes (Signed)
Based on what you shared with me, I feel your condition warrants further evaluation and I recommend that you be seen for a face to face office visit.  I am sorry, but we do not treat abscess tooth through an evisit.    NOTE: If you entered your credit card information for this eVisit, you will not be charged. You may see a "hold" on your card for the $35 but that hold will drop off and you will not have a charge processed.  If you are having a true medical emergency please call 911.  If you need an urgent face to face visit, [place] has four urgent care centers for your convenience.    PLEASE NOTE: THE [facility] LOCATIONS AND URGENT CARE CLINICS DO NOT HAVE THE TESTING FOR CORONAVIRUS COVID19 AVAILABLE.  IF YOU FEEL YOU NEED THIS TEST YOU MUST GO TO A TRIAGE LOCATION AT [place]   [email or web address] to reserve your spot online an avoid wait times  [facility] [address], [address] [place], [place] [address] Modified hours of operation: Monday-Friday, 10 AM to 6 PM  Saturday & Sunday 10 AM to 4 PM *Across the street from [place] (New Address!) [address], [place], [place] [address] *Just off [address], across the road from [place] hours of operation: Monday-Friday, 10 AM to 5 PM  Closed Saturday & Sunday   The following sites will take your insurance:  . [facility] [place] a Provider at this Location  [address] [place], [place] [address] . 10 am to 8 pm Monday-Friday . 12 pm to 8 pm Saturday-Sunday   . [facility] at Home a Provider at this Location  [address], [place], [place] [address] . 8 am to 8 pm Monday-Friday . 9 am to 6 pm Saturday . 11 am to 6 pm Sunday   . [facility] at  [place] Get Driving Directions  [address] [place], [place] [address] . 8 am to 8 pm Monday-Friday . 8 am to 4 pm Saturday-Sunday   Your e-visit answers were reviewed by a board certified advanced clinical practitioner to complete your personal care plan.  Thank you for using e-Visits.

## 2019-09-21 ENCOUNTER — Encounter: Payer: 59 | Admitting: Family

## 2020-05-14 ENCOUNTER — Ambulatory Visit (INDEPENDENT_AMBULATORY_CARE_PROVIDER_SITE_OTHER): Payer: 59 | Admitting: Family

## 2020-05-14 ENCOUNTER — Other Ambulatory Visit: Payer: Self-pay

## 2020-05-14 ENCOUNTER — Encounter: Payer: Self-pay | Admitting: Family

## 2020-05-14 VITALS — BP 161/85 | HR 63 | Temp 98.3°F | Resp 16 | Ht 71.0 in | Wt 210.8 lb

## 2020-05-14 DIAGNOSIS — K635 Polyp of colon: Secondary | ICD-10-CM

## 2020-05-14 DIAGNOSIS — M069 Rheumatoid arthritis, unspecified: Secondary | ICD-10-CM

## 2020-05-14 DIAGNOSIS — Z0000 Encounter for general adult medical examination without abnormal findings: Secondary | ICD-10-CM | POA: Diagnosis not present

## 2020-05-14 DIAGNOSIS — I10 Essential (primary) hypertension: Secondary | ICD-10-CM | POA: Diagnosis not present

## 2020-05-14 MED ORDER — MELOXICAM 7.5 MG PO TABS: 7.5000 mg | ORAL_TABLET | Freq: Every day | ORAL | 5 refills | Status: DC

## 2020-05-14 MED ORDER — AMLODIPINE BESYLATE 5 MG PO TABS: 5.0000 mg | ORAL_TABLET | Freq: Every day | ORAL | 3 refills | Status: DC

## 2020-05-14 NOTE — Patient Instructions (Signed)
Please complete lab work prior to leaving.  Continue to work on healthy low sodium diet, exercise and weight loss. Begin amlodipine 5mg  once daily for blood pressure. You should be contacted about scheduling your appointment with rheumatology and for colonoscopy.

## 2020-05-14 NOTE — Progress Notes (Signed)
Subjective:    Patient ID: William Neal, male    DOB: July 26, 1956, 64 y.o.   MRN: [id number]  HPI  Patient presents today for complete physical.  Immunizations: [id number] Diet: fair diet Exercise:  Rides his bike 2-3 times a week for 30-40 minutes Colonoscopy: Lab Results  Component Value Date   PSA 0.50 03/27/2017   PSA 0.62 03/10/2016   PSA 0.57 10/12/2012  Vision:  due Dental: up to date  Quit smoking back in May.     Review of Systems  Constitutional: Negative for unexpected weight change.  HENT: Negative for hearing loss and rhinorrhea.   Eyes: Negative for visual disturbance.  Respiratory: Negative for cough and shortness of breath.   Cardiovascular: Negative for chest pain.  Gastrointestinal: Negative for blood in stool, constipation and diarrhea.  Genitourinary: Negative for difficulty urinating and hematuria.  Musculoskeletal: Positive for arthralgias (secondary to RA). Negative for myalgias.  Skin: Negative for rash.  Neurological: Positive for headaches (occasional, better than they used to be).  Hematological: Negative for adenopathy.  Psychiatric/Behavioral:       Denies depression/anxiety       Past Medical History:  Diagnosis Date  . BPH (benign prostatic hyperplasia) 10/12/2012  . History of hepatitis C      Social History   Socioeconomic History  . Marital status: Married    Spouse name: Not on file  . Number of children: Not on file  . Years of education: Not on file  . Highest education level: Not on file  Occupational History  . Not on file  Tobacco Use  . Smoking status: Former Smoker    Packs/day: 0.50    Years: 40.00    Pack years: 20.00    Types: Cigarettes    Quit date: 02/07/2020    Years since quitting: 0.2  . Smokeless tobacco: Never Used  Vaping Use  . Vaping Use: Never used  Substance and Sexual Activity  . 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 Drug use: Never  . Sexual  activity: Not on file  Other Topics Concern  . Not on file  Social History Narrative   Married to [employer], [employer] program   Married   2 step children   Enjoys fishing   Completed college   Social Determinants of Radio broadcast assistant Strain:   . Difficulty of Paying Living Expenses: Not on file  Food Insecurity:   . Worried About Charity fundraiser in the Last Year: Not on file  . Ran Out of Food in the Last Year: Not on file  Transportation Needs:   . Lack of Transportation (Medical): Not on file  . Lack of Transportation (Non-Medical): Not on file  Physical Activity:   . Days of Exercise per Week: Not on file  . Minutes of Exercise per Session: Not on file  Stress:   . Feeling of Stress : Not on file  Social Connections:   . Frequency of Communication with Friends and Family: Not on file  . Frequency of Social Gatherings with Friends and Family: Not on file  . Attends Religious Services: Not on file  . Active Member of Clubs or Organizations: Not on file  . Attends Archivist Meetings: Not on file  . Marital Status: Not on file  Intimate Partner Violence:   . Fear of Current or Ex-Partner: Not on file  . Emotionally Abused: Not  on file  . Physically Abused: Not on file  . Sexually Abused: Not on file    Past Surgical History:  Procedure Laterality Date  . no prior surgery      Family History  Problem Relation Age of Onset  . Diabetes Mother   . Emphysema Father   . Hypertension Maternal Uncle   . Coronary artery disease Brother        multiple stents, 1/2 brother  . Heart disease Other 80       died in his sleep, ?presumed heart attack  . Hypertension Sister   . Colon cancer Neg Hx   . Esophageal cancer Neg Hx   . Rectal cancer Neg Hx   . Stomach cancer Neg Hx     No Known Allergies  Current Outpatient Medications on File Prior to Visit  Medication Sig Dispense Refill  . hydroxychloroquine (PLAQUENIL) 200 MG tablet Take  200mg  by mouth twice daily. (Patient not taking: Reported on 05/14/2020) 60 tablet 2   No current facility-administered medications on file prior to visit.    BP (!) 161/85 (BP Location: Right Arm, Patient Position: Sitting, Cuff Size: Small)   Pulse 63   Temp 98.3 F (36.8 C) (Oral)   Resp 16   Ht 5\' 11"  (1.803 m)   Wt 210 lb 12.8 oz (95.6 kg)   SpO2 97%   BMI 29.40 kg/m    Objective:   Physical Exam Constitutional:      General: He is not in acute distress.    Appearance: He is well-developed.  HENT:     Head: Normocephalic and atraumatic.  Neck:     Thyroid: No thyroid mass.  Cardiovascular:     Rate and Rhythm: Normal rate and regular rhythm.     Heart sounds: No murmur heard.   Pulmonary:     Effort: Pulmonary effort is normal. No respiratory distress.     Breath sounds: Normal breath sounds. No wheezing or rales.  Abdominal:     General: Bowel sounds are normal.     Palpations: Abdomen is soft.     Tenderness: There is no abdominal tenderness.  Lymphadenopathy:     Cervical: No cervical adenopathy.  Skin:    General: Skin is warm and dry.  Neurological:     Mental Status: He is alert and oriented to person, place, and time.  Psychiatric:        Behavior: Behavior normal.        Thought Content: Thought content normal.           Assessment & Plan:  Preventative care- discussed healthy diet and exercise. I commended pt on quitting smoking.  Recommended Shingrix, covid vaccine and flu shot.  Pt declines.  We discussed that he is at high risk for covid complications due to RA and that the Covid vaccine would offer him protection.  He plans to get the flu shot in a few weeks on his own. Refer for colonoscopy (this is overdue).   HTN- rechecked bp manually 165/85. Recommended low sodium diet and addition of amlodipine once daily.  BP Readings from Last 3 Encounters:  05/14/20 (!) 161/85  02/25/18 125/78  11/26/17 (!) 148/88   RA- uncontrolled. Requesting  to restart meloxicam. Will also refer back to rheumatology who he has not seen since 2019.  This visit occurred during the SARS-CoV-2 public health emergency.  Safety protocols were in place, including screening questions prior to the visit, additional usage of staff PPE, and  extensive cleaning of exam room while observing appropriate contact time as indicated for disinfecting solutions.

## 2020-05-15 LAB — HEPATIC FUNCTION PANEL
AG Ratio: 2 (calc) (ref 1.0–2.5)
ALT: 16 U/L (ref 9–46)
AST: 19 U/L (ref 10–35)
Albumin: 4.3 g/dL (ref 3.6–5.1)
Alkaline phosphatase (APISO): 94 U/L (ref 35–144)
Bilirubin, Direct: 0.1 mg/dL (ref 0.0–0.2)
Globulin: 2.2 g/dL (calc) (ref 1.9–3.7)
Indirect Bilirubin: 0.2 mg/dL (calc) (ref 0.2–1.2)
Total Bilirubin: 0.3 mg/dL (ref 0.2–1.2)
Total Protein: 6.5 g/dL (ref 6.1–8.1)

## 2020-05-15 LAB — LIPID PANEL
Cholesterol: 182 mg/dL (ref <200)
HDL: 36 mg/dL — ABNORMAL LOW (ref >=40)
LDL Cholesterol (Calc): 122 mg/dL (calc) — ABNORMAL HIGH
Non-HDL Cholesterol (Calc): 146 mg/dL (calc) — ABNORMAL HIGH (ref <130)
Total CHOL/HDL Ratio: 5.1 (calc) — ABNORMAL HIGH (ref <5.0)
Triglycerides: 127 mg/dL (ref <150)

## 2020-05-15 LAB — TSH: TSH: 1.72 mIU/L (ref 0.40–4.50)

## 2020-05-15 LAB — BASIC METABOLIC PANEL
BUN: 17 mg/dL (ref 7–25)
CO2: 25 mmol/L (ref 20–32)
Calcium: 9.4 mg/dL (ref 8.6–10.3)
Chloride: 106 mmol/L (ref 98–110)
Creat: 1.04 mg/dL (ref 0.70–1.25)
Glucose, Bld: 94 mg/dL (ref 65–99)
Potassium: 4.5 mmol/L (ref 3.5–5.3)
Sodium: 140 mmol/L (ref 135–146)

## 2020-05-15 LAB — CBC WITH DIFFERENTIAL/PLATELET
Absolute Monocytes: 898 cells/uL (ref 200–950)
Basophils Absolute: 60 cells/uL (ref 0–200)
Basophils Relative: 0.9 %
Eosinophils Absolute: 201 cells/uL (ref 15–500)
Eosinophils Relative: 3 %
HCT: 44 % (ref 38.5–50.0)
Hemoglobin: 15.2 g/dL (ref 13.2–17.1)
Lymphs Abs: 1702 cells/uL (ref 850–3900)
MCH: 32.3 pg (ref 27.0–33.0)
MCHC: 34.5 g/dL (ref 32.0–36.0)
MCV: 93.4 fL (ref 80.0–100.0)
MPV: 11.4 fL (ref 7.5–12.5)
Monocytes Relative: 13.4 %
Neutro Abs: 3839 cells/uL (ref 1500–7800)
Neutrophils Relative %: 57.3 %
Platelets: 209 10*3/uL (ref 140–400)
RBC: 4.71 10*6/uL (ref 4.20–5.80)
RDW: 12.9 % (ref 11.0–15.0)
Total Lymphocyte: 25.4 %
WBC: 6.7 10*3/uL (ref 3.8–10.8)

## 2020-05-15 LAB — PSA: PSA: 0.45 ng/mL (ref <=4.0)

## 2020-05-27 NOTE — Progress Notes (Signed)
Subjective:    Patient ID: William Neal, male    DOB: [birth year], 64 y.o.   MRN: [id number]  HPI  Patient is a 64 yr old male who presents today for follow up.  HTN- last visit his blood pressure was noted to be elevated and we added amlodipine 5mg  once daily. He states that he is tolerating without side effect.  BP Readings from Last 3 Encounters:  05/28/20 128/60  05/14/20 (!) 161/85  02/25/18 125/78    RA- reports flare up of left knee pain. States that he has not scheduled follow up with he rheumatologist. States that meloxicam seems to help with the knee swelling but not with the pain.    Review of Systems    see HPI  Past Medical History:  Diagnosis Date  . BPH (benign prostatic hyperplasia) 10/12/2012  . History of hepatitis C      Social History   Socioeconomic History  . Marital status: Married    Spouse name: Not on file  . Number of children: Not on file  . Years of education: Not on file  . Highest education level: Not on file  Occupational History  . Not on file  Tobacco Use  . Smoking status: Former Smoker    Packs/day: 0.50    Years: 40.00    Pack years: 20.00    Types: Cigarettes    Quit date: 02/07/2020    Years since quitting: 0.3  . Smokeless tobacco: Never Used  Vaping Use  . Vaping Use: Never used  Substance and Sexual Activity  . 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 Drug use: Never  . Sexual activity: Not on file  Other Topics Concern  . Not on file  Social History Narrative   Married to [employer], [employer] program   Married   2 step children   Enjoys fishing   Completed college   Social Determinants of Radio broadcast assistant Strain:   . Difficulty of Paying Living Expenses: Not on file  Food Insecurity:   . Worried About Charity fundraiser in the Last Year: Not on file  . Ran Out of Food in the Last Year: Not on file  Transportation Needs:   . Lack of  Transportation (Medical): Not on file  . Lack of Transportation (Non-Medical): Not on file  Physical Activity:   . Days of Exercise per Week: Not on file  . Minutes of Exercise per Session: Not on file  Stress:   . Feeling of Stress : Not on file  Social Connections:   . Frequency of Communication with Friends and Family: Not on file  . Frequency of Social Gatherings with Friends and Family: Not on file  . Attends Religious Services: Not on file  . Active Member of Clubs or Organizations: Not on file  . Attends Archivist Meetings: Not on file  . Marital Status: Not on file  Intimate Partner Violence:   . Fear of Current or Ex-Partner: Not on file  . Emotionally Abused: Not on file  . Physically Abused: Not on file  . Sexually Abused: Not on file    Past Surgical History:  Procedure Laterality Date  . no prior surgery      Family History  Problem Relation Age of Onset  . Diabetes Mother   . Emphysema Father   . Hypertension Maternal Uncle   . Coronary artery disease  Brother        multiple stents, 1/2 brother  . Heart disease Other 80       died in his sleep, ?presumed heart attack  . Hypertension Sister   . Colon cancer Neg Hx   . Esophageal cancer Neg Hx   . Rectal cancer Neg Hx   . Stomach cancer Neg Hx     No Known Allergies  Current Outpatient Medications on File Prior to Visit  Medication Sig Dispense Refill  . amLODipine (NORVASC) 5 MG tablet Take 1 tablet (5 mg total) by mouth daily. 30 tablet 3  . meloxicam (MOBIC) 7.5 MG tablet Take 1 tablet (7.5 mg total) by mouth daily. 30 tablet 5   No current facility-administered medications on file prior to visit.    BP 128/60 (BP Location: Left Arm, Patient Position: Sitting, Cuff Size: Normal)   Pulse (!) 56   Resp 16   Ht 5\' 11"  (1.803 m)   Wt 215 lb (97.5 kg)   SpO2 94%   BMI 29.99 kg/m    Objective:   Physical Exam Constitutional:      General: He is not in acute distress.    Appearance:  He is well-developed.  HENT:     Head: Normocephalic and atraumatic.  Cardiovascular:     Rate and Rhythm: Normal rate and regular rhythm.     Heart sounds: No murmur heard.   Pulmonary:     Effort: Pulmonary effort is normal. No respiratory distress.     Breath sounds: Normal breath sounds. No wheezing or rales.  Skin:    General: Skin is warm and dry.  Neurological:     Mental Status: He is alert and oriented to person, place, and time.  Psychiatric:        Behavior: Behavior normal.        Thought Content: Thought content normal.           Assessment & Plan:  HTN- blood pressure is improved and at goal on amlodipine 5mg  once daily. Continue same.  RA- uncontrolled. Continue meloxicam 7.5mg  PO once daily prn. Advised pt to schedule follow up with his rheumatologist as well.  This visit occurred during the SARS-CoV-2 public health emergency.  Safety protocols were in place, including screening questions prior to the visit, additional usage of staff PPE, and extensive cleaning of exam room while observing appropriate contact time as indicated for disinfecting solutions.

## 2020-05-28 ENCOUNTER — Encounter: Payer: Self-pay | Admitting: Family

## 2020-05-28 ENCOUNTER — Other Ambulatory Visit: Payer: Self-pay

## 2020-05-28 ENCOUNTER — Ambulatory Visit: Payer: 59 | Admitting: Family

## 2020-05-28 VITALS — BP 128/60 | HR 56 | Resp 16 | Ht 71.0 in | Wt 215.0 lb

## 2020-05-28 DIAGNOSIS — M069 Rheumatoid arthritis, unspecified: Secondary | ICD-10-CM | POA: Diagnosis not present

## 2020-05-28 DIAGNOSIS — I10 Essential (primary) hypertension: Secondary | ICD-10-CM | POA: Diagnosis not present

## 2020-05-28 NOTE — Patient Instructions (Signed)
Please schedule follow up with Dr. Estanislado Pandy. Continue current dose of amlodipine once daily.

## 2020-06-14 NOTE — Progress Notes (Signed)
Office Visit Note  Patient: William Neal             Date of Birth: 10/20/1955           MRN: [id number]             PCP: Debbrah Alar, NP Referring: Debbrah Alar, NP Visit Date: 06/22/2020 Occupation: _0 @  Subjective:  Other (increased joint pain, stiffness, swelling )   History of Present Illness: William Neal is a 64 y.o. male with history of seropositive rheumatoid arthritis.  He returns today after his last visit in June 2019.  At that time he was on Plaquenil only and sulfasalazine was added.  He states he took sulfasalazine until it ran out for 3 months and the combination therapy helped him.  He realized that we were out of network and the visits were too expensive.  He lost follow-up.  He has been off medication since then.  He has been taking meloxicam for the last few months due to severe swelling.  He has been having increased pain and swelling in the last 6 months.  The pain has been mostly in his hands, knee joints and his ankles.  Activities of Daily Living:  Patient reports morning stiffness for 30 minutes.   Patient Reports nocturnal pain.  Difficulty dressing/grooming: Reports Difficulty climbing stairs: Reports Difficulty getting out of chair: Reports Difficulty using hands for taps, buttons, cutlery, and/or writing: Reports  Review of Systems  Constitutional: Positive for fatigue.  HENT: Negative for mouth sores, mouth dryness and nose dryness.   Eyes: Positive for redness and itching. Negative for pain and dryness.  Respiratory: Negative for shortness of breath and difficulty breathing.   Cardiovascular: Negative for chest pain and palpitations.  Gastrointestinal: Negative for blood in stool, constipation and diarrhea.  Endocrine: Negative for increased urination.  Genitourinary: Negative for difficulty urinating and painful urination.  Musculoskeletal: Positive for arthralgias, joint pain, joint swelling and morning stiffness. Negative for  myalgias, muscle tenderness and myalgias.  Skin: Negative for color change, rash and redness.  Allergic/Immunologic: Negative for susceptible to infections.  Neurological: Positive for headaches. Negative for dizziness, numbness, memory loss and weakness.  Hematological: Positive for bruising/bleeding tendency.  Psychiatric/Behavioral: Negative for confusion.    PMFS History:  Patient Active Problem List   Diagnosis Date Noted  . Essential hypertension 05/14/2020  . Rheumatoid arthritis ([place]) 05/14/2020  . Other malaise and fatigue 04/24/2014  . Skin cyst 04/24/2014  . Routine general medical examination at a health care facility 10/12/2012  . History of hepatitis C 10/12/2012  . History of colon polyps 10/12/2012  . BPH (benign prostatic hyperplasia) 10/12/2012    Past Medical History:  Diagnosis Date  . BPH (benign prostatic hyperplasia) 10/12/2012  . History of hepatitis C     Family History  Problem Relation Age of Onset  . Diabetes Mother   . Emphysema Father   . Hypertension Maternal Uncle   . Coronary artery disease Brother        multiple stents, 1/2 brother  . Heart disease Other 80       died in his sleep, ?presumed heart attack  . Hypertension Sister   . Colon cancer Neg Hx   . Esophageal cancer Neg Hx   . Rectal cancer Neg Hx   . Stomach cancer Neg Hx    Past Surgical History:  Procedure Laterality Date  . 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 Influenza, Quadrivalent, Recombinant, Inj, Pf 05/22/2019  . Influenza,inj,Quad PF,6+ Mos 05/29/2017  . Td 09/01/1998  . Tdap 10/12/2012     Objective: Vital Signs: BP 136/74 (BP Location: Left Arm, Patient Position: Sitting, Cuff Size: Normal)   Pulse (!) 55   Resp 15   Ht _0  (1.803 m)   Wt 216 lb  9.6 oz (98.2 kg)   BMI 30.21 kg/m    Physical Exam Vitals and nursing note reviewed.  Constitutional:      Appearance: He is well-developed.  HENT:     Head: Normocephalic and atraumatic.  Eyes:     Conjunctiva/sclera: Conjunctivae normal.     Pupils: Pupils are equal, round, and reactive to light.  Cardiovascular:     Rate and Rhythm: Normal rate and regular rhythm.     Heart sounds: Normal heart sounds.  Pulmonary:     Effort: Pulmonary effort is normal.     Breath sounds: Normal breath sounds.  Abdominal:     General: Bowel sounds are normal.     Palpations: Abdomen is soft.  Musculoskeletal:     Cervical back: Normal range of motion and neck supple.  Skin:    General: Skin is warm and dry.     Capillary Refill: Capillary refill takes less than 2 seconds.  Neurological:     Mental Status: He is alert and oriented to person, place, and time.  Psychiatric:        Behavior: Behavior normal.      Musculoskeletal Exam: C-spine thoracic and lumbar spine with good range of motion.  Shoulder joints, elbow joints, wrist joints with good range of motion.  He has synovitis over some of his PIP joints as described below.  He had good range of motion of his hip joints.  He had effusion in his left knee joint.  There was warmth on palpation of his right knee joint.  He had painful range of motion of bilateral knee joints.  He is swelling and warmth and palpation of bilateral ankle joints.  There was some tenderness over MTPs.  CDAI Exam: CDAI Score: 14.4  Patient Global: 7 mm; Provider Global: 7 mm Swollen: 8 ; Tender: 12  Joint Exam 06/22/2020      Right  Left  PIP 2     Swollen Tender  PIP 3  Swollen Tender  Swollen Tender  PIP 4  Swollen Tender  Swollen Tender  Knee   Tender  Swollen Tender  Ankle  Swollen Tender  Swollen Tender  MTP 2      Tender  MTP 3      Tender  MTP 4      Tender     Investigation: No additional findings.  Imaging: No results found.  Recent  Labs: Lab Results  Component Value Date   WBC 6.7 05/14/2020   HGB 15.2 05/14/2020   PLT 209 05/14/2020   NA 140 05/14/2020   K 4.5 05/14/2020   CL 106 05/14/2020   CO2 25 05/14/2020   GLUCOSE 94 05/14/2020   BUN 17 05/14/2020   CREATININE 1.04 05/14/2020   BILITOT 0.3 05/14/2020   ALKPHOS 84 03/27/2017   AST 19 05/14/2020   ALT 16 05/14/2020   PROT 6.5 05/14/2020   ALBUMIN 4.4 03/27/2017   CALCIUM  9.4 05/14/2020   GFRAA 96 12/28/2017    Speciality Comments: PLQ Eye Exam: 12/21/17  WNL @ [facility] follow up in 6 months.   Procedures:  No procedures performed Allergies: Patient has no active allergies.   Assessment / Plan:     Visit Diagnoses: Rheumatoid arthritis with rheumatoid factor of multiple sites without organ or systems involvement (HCC) -  RF 35, anti-CCP negative, 14 338 and negative, ESR normal. X-ray of bilateral hands showed second and third MCP narrowing: He has been off medications for almost 2-1/2-years.  He is having severe flare with pain and discomfort and swelling in multiple joints.  He states his symptoms improved once he started taking Mobic.  He had synovitis in multiple joints and left knee joint effusion.  He had detailed discussion regarding treatment options and their side effects.  He is very hesitant to go on any aggressive medications because of history of recurrent infections.  He has tried Plaquenil and sulfasalazine in the past and is willing to try both medications again.  Detailed counseling was provided.  Handout was given and consent was taken.  We will start him on Plaquenil 200 mg p.o. twice daily after the lab results are available.  Sulfasalazine 500 mg 2 tablets p.o. twice daily will be called in after the lab results are available.  As he is having severe pain and discomfort I will give him a prednisone taper starting at 20 mg and taper by 5 mg every 4 days.- Plan: Sedimentation rate  Patient was counseled on the purpose, proper  use, and adverse effects of hydroxychloroquine including nausea/diarrhea, skin rash, headaches, and sun sensitivity.  Discussed importance of annual eye exams while on hydroxychloroquine to monitor to ocular toxicity and discussed importance of frequent laboratory monitoring.  Provided patient with eye exam form for baseline ophthalmologic exam.  Provided patient with educational materials on hydroxychloroquine and answered all questions.  Patient consented to hydroxychloroquine.  Will upload consent in the media tab.    Dose will be Plaquenil 200 mg twice daily.  Prescription pending lab results.  Medication counseling:  Baseline Immunosuppressant Labs TB GOLD   Hepatitis Panel Hepatitis Latest Ref Rng & Units 10/29/2017  Hep B Surface Ag NON-REACTI NON-REACTIVE  Hep B IgM NON-REACTI NON-REACTIVE   HIV No results found for: HIV Immunoglobulins   SPEP Serum Protein Electrophoresis Latest Ref Rng & Units 05/14/2020  Total Protein 6.1 - 8.1 g/dL 6.5  Albumin 3.8 - 4.8 g/dL -  Alpha-1 0.2 - 0.3 g/dL -  Alpha-2 0.5 - 0.9 g/dL -  Beta Globulin 0.4 - 0.6 g/dL -  Beta 2 0.2 - 0.5 g/dL -  Gamma Globulin 0.8 - 1.7 g/dL -   G6PD Lab Results  Component Value Date   G6PDH 12.9 10/29/2017   TPMT No results found for: TPMT   Chest x-ray: February 03/2017 chest x-ray normal  Does the patient have an allergy to sulfa drugs? No  Patient was counseled on the purpose, proper use, and adverse effects of sulfasalazine including risk of infection and chance of nausea, headache, and sun sensitivity.  Also discussed risk of skin rash and advised patient to stop the medication and let us know if she develops a rash. Also discussed for the potential of discoloration of the urine, sweat, or tears.  Advised patient to avoid live vaccines.  Recommend annual influenza, Pneumovax 23, Prevnar 13, and Shingrix as indicated.   Reviewed the importance of frequent labs to monitor liver, kidneys,  and blood counts.   Standing orders placed and patient to return 1 month after starting therapy and then every 3 months.  Provided patient with educational materials on sulfasalazine and answered all questions.  Patient consented to sulfasalazine use, and consent will be uploaded into the media tab.    Patient dose will be 500 mg, 2 tablets p.o. twice daily.  Prescription will be sent to pharmacy pending lab results and insurance approval.   High risk medication use - PLQ BID, added SSZ at last visit on 02/25/18 then he discontinued the medications.- Plan: CBC with Differential/Platelet, COMPLETE METABOLIC PANEL WITH GFR today, 1 month and then every 3 months.  Pain in both hands -has been having pain and discomfort in his bilateral hands.  Plan: XR Hand 2 View Right, XR Hand 2 View Left.  X-ray of bilateral hands were consistent with osteoarthritis and rheumatoid arthritis overlap.  None of the changes were noted.  Primary osteoarthritis of both hands-he also has severe osteoarthritis in his hands.  Primary osteoarthritis of left knee-he has been having pain and discomfort in his bilateral knee joints.  He has fusion in his left knee joint.  Pain in both feet -he has pain and discomfort in the bilateral ankles and bilateral feet.  Plan: XR Foot 2 Views Right, XR Foot 2 Views Left.  X-rays were consistent with osteoarthritis.  Primary osteoarthritis of both feet  History of colon polyps  History of hepatitis C - Treated.  Repeat Hep C testing negative.  Educated about COVID-19 virus infection-he has not had COVID-19 vaccination.  COVID-19 vaccination was advised.  He has been advised to get COVID-19 when he is on prednisone below 20 mg.  Use of mask, social distancing and hand hygiene was discussed.  Use of monoclonal antibodies were discussed if he develops COVID-19 infection.  Information was placed in the handout.  Orders: Orders Placed This Encounter  Procedures  . XR Hand 2 View Right  . XR Hand 2 View  Left  . XR Foot 2 Views Right  . XR Foot 2 Views Left  . CBC with Differential/Platelet  . COMPLETE METABOLIC PANEL WITH GFR  . Sedimentation rate   Meds ordered this encounter  Medications  . predniSONE (DELTASONE) 5 MG tablet    Sig: Take 4 tabs po qd x 4 days, 3  tabs po qd x 4 days, 2  tabs po qd x 4 days, 1  tab po qd x 4 days    Dispense:  40 tablet    Refill:  0     Follow-Up Instructions: Return in about 6 weeks (around 08/03/2020) for Rheumatoid arthritis.   Bo Merino, MD  Note - This record has been created using Editor, commissioning.  Chart creation errors have been sought, but may not always  have been located. Such creation errors do not reflect on  the standard of medical care.

## 2020-06-22 ENCOUNTER — Encounter: Payer: Self-pay | Admitting: Rheumatology

## 2020-06-22 ENCOUNTER — Other Ambulatory Visit: Payer: Self-pay

## 2020-06-22 ENCOUNTER — Ambulatory Visit: Payer: Self-pay

## 2020-06-22 ENCOUNTER — Ambulatory Visit (INDEPENDENT_AMBULATORY_CARE_PROVIDER_SITE_OTHER): Payer: 59 | Admitting: Rheumatology

## 2020-06-22 VITALS — BP 136/74 | HR 55 | Resp 15 | Ht 71.0 in | Wt 216.6 lb

## 2020-06-22 DIAGNOSIS — M79672 Pain in left foot: Secondary | ICD-10-CM | POA: Diagnosis not present

## 2020-06-22 DIAGNOSIS — M0579 Rheumatoid arthritis with rheumatoid factor of multiple sites without organ or systems involvement: Secondary | ICD-10-CM

## 2020-06-22 DIAGNOSIS — Z79899 Other long term (current) drug therapy: Secondary | ICD-10-CM | POA: Diagnosis not present

## 2020-06-22 DIAGNOSIS — M19041 Primary osteoarthritis, right hand: Secondary | ICD-10-CM

## 2020-06-22 DIAGNOSIS — M19072 Primary osteoarthritis, left ankle and foot: Secondary | ICD-10-CM

## 2020-06-22 DIAGNOSIS — Z7189 Other specified counseling: Secondary | ICD-10-CM

## 2020-06-22 DIAGNOSIS — Z86010 Personal history of colonic polyps: Secondary | ICD-10-CM

## 2020-06-22 DIAGNOSIS — Z8619 Personal history of other infectious and parasitic diseases: Secondary | ICD-10-CM

## 2020-06-22 DIAGNOSIS — M79641 Pain in right hand: Secondary | ICD-10-CM | POA: Diagnosis not present

## 2020-06-22 DIAGNOSIS — M19071 Primary osteoarthritis, right ankle and foot: Secondary | ICD-10-CM

## 2020-06-22 DIAGNOSIS — M79642 Pain in left hand: Secondary | ICD-10-CM | POA: Diagnosis not present

## 2020-06-22 DIAGNOSIS — M1712 Unilateral primary osteoarthritis, left knee: Secondary | ICD-10-CM

## 2020-06-22 DIAGNOSIS — M19042 Primary osteoarthritis, left hand: Secondary | ICD-10-CM

## 2020-06-22 DIAGNOSIS — M79671 Pain in right foot: Secondary | ICD-10-CM

## 2020-06-22 MED ORDER — PREDNISONE 5 MG PO TABS: ORAL_TABLET | ORAL | 0 refills | Status: DC

## 2020-06-22 NOTE — Telephone Encounter (Signed)
Pending lab results, patient will be starting plaquenil and sulfasalazine, per Dr. Estanislado Pandy.   Consent obtained for both and sent to the scan center.

## 2020-06-22 NOTE — Patient Instructions (Signed)
Standing Labs We placed an order today for your standing lab work.   Please have your standing labs drawn in 1 month and then every 3 months.   If possible, please have your labs drawn 2 weeks prior to your appointment so that the provider can discuss your results at your appointment.  We have open lab daily Monday through Thursday from 8:30-12:30 PM and 1:30-4:30 PM and Friday from 8:30-12:30 PM and 1:30-4:00 PM at the office of Dr. Bo Merino, [facility].   Please be advised, patients with office appointments requiring lab work will take precedents over walk-in lab work.  If possible, please come for your lab work on Monday and Friday afternoons, as you may experience shorter wait times. The office is located at [address] [place], [place], [place] [address] No appointment is necessary.   Labs are drawn by [facility]. Please bring your co-pay at the time of your lab draw.  You may receive a bill from [facility] for your lab work.  If you wish to have your labs drawn at another location, please call the office 24 hours in advance to send orders.  If you have any questions regarding directions or hours of operation,  please call [phone].   As a reminder, please drink plenty of water prior to coming for your lab work. Thanks!    Sulfasalazine tablets What is this medicine? SULFASALAZINE (sul fa SAL a zeen) is used to treat ulcerative colitis. This medicine may be used for other purposes; ask your health care provider or pharmacist if you have questions. COMMON BRAND NAME(S): Azulfidine, Sulfazine What should I tell my health care provider before I take this medicine? They need to know if you have any of these conditions:  asthma  blood disorders or anemia  glucose-6-phosphate dehydrogenase (G6PD) deficiency  intestinal obstruction  kidney disease  liver disease  porphyria  urinary tract obstruction  an unusual reaction to sulfasalazine, sulfa  drugs, salicylates, or other medicines, foods, dyes, or preservatives  pregnant or trying to get pregnant  breast-feeding How should I use this medicine? Take this medicine by mouth with a full glass of water. Follow the directions on the prescription label. If the medicine upsets your stomach, take it with food or milk. Take your medicine at regular intervals. Do not take your medicine more often than directed. Do not stop taking except on your doctor's advice. Talk to your pediatrician regarding the use of this medicine in children. While this drug may be prescribed for children as young as 6 years for selected conditions, precautions do apply. Patients over 24 years old may have a stronger reaction and need a smaller dose. Overdosage: If you think you have taken too much of this medicine contact a poison control center or emergency room at once. NOTE: This medicine is only for you. Do not share this medicine with others. What if I miss a dose? If you miss a dose, take it as soon as you can. If it is almost time for your next dose, take only that dose. Do not take double or extra doses. What may interact with this medicine?  digoxin  folic acid This list may not describe all possible interactions. Give your health care provider a list of all the medicines, herbs, non-prescription drugs, or dietary supplements you use. Also tell them if you smoke, drink alcohol, or use illegal drugs. Some items may interact with your medicine. What should I watch for while using this medicine? Visit  your doctor or health care professional for regular checks on your progress. You will need frequent blood and urine checks. This medicine can make you more sensitive to the sun. Keep out of the sun. If you cannot avoid being in the sun, wear protective clothing and use sunscreen. Do not use sun lamps or tanning beds/booths. Drink plenty of water while taking this medicine. What side effects may I notice from  receiving this medicine? Side effects that you should report to your doctor or health care professional as soon as possible:  allergic reactions like skin rash, itching or hives, swelling of the face, lips, or tongue  fever, chills, or any other sign of infection  painful, difficult, or reduced urination  redness, blistering, peeling or loosening of the skin, including inside the mouth  severe stomach pain  unusual bleeding or bruising  unusually weak or tired  yellowing of the skin or eyes Side effects that usually do not require medical attention (report to your doctor or health care professional if they continue or are bothersome):  headache  loss of appetite  nausea, vomiting  orange color to the urine  reduced sperm count This list may not describe all possible side effects. Call your doctor for medical advice about side effects. You may report side effects to FDA at 1-800-FDA-1088. Where should I keep my medicine? Keep out of the reach of children. Store at room temperature between 15 and 30 degrees C (59 and 86 degrees F). Keep container tightly closed. Throw away any unused medicine after the expiration date. NOTE: This sheet is a summary. It may not cover all possible information. If you have questions about this medicine, talk to your doctor, pharmacist, or health care provider.  2020 Elsevier/Gold Standard (2008-04-19 11:38:15)   Hydroxychloroquine tablets What is this medicine? HYDROXYCHLOROQUINE (hye drox ee KLOR oh kwin) is used to treat rheumatoid arthritis and systemic lupus erythematosus. It is also used to treat malaria. This medicine may be used for other purposes; ask your health care provider or pharmacist if you have questions. COMMON BRAND NAME(S): Plaquenil, Quineprox What should I tell my health care provider before I take this medicine? They need to know if you have any of these conditions:  diabetes  eye disease, vision problems  G6PD  deficiency  heart disease  history of irregular heartbeat  if you often drink alcohol  kidney disease  liver disease  porphyria  psoriasis  an unusual or allergic reaction to chloroquine, hydroxychloroquine, other medicines, foods, dyes, or preservatives  pregnant or trying to get pregnant  breast-feeding How should I use this medicine? Take this medicine by mouth with a glass of water. Follow the directions on the prescription label. Do not cut, crush or chew this medicine. Swallow the tablets whole. Take this medicine with food. Avoid taking antacids within 4 hours of taking this medicine. It is best to separate these medicines by at least 4 hours. Take your medicine at regular intervals. Do not take it more often than directed. Take all of your medicine as directed even if you think you are better. Do not skip doses or stop your medicine early. Talk to your pediatrician regarding the use of this medicine in children. While this drug may be prescribed for selected conditions, precautions do apply. Overdosage: If you think you have taken too much of this medicine contact a poison control center or emergency room at once. NOTE: This medicine is only for you. Do not share this medicine with  others. What if I miss a dose? If you miss a dose, take it as soon as you can. If it is almost time for your next dose, take only that dose. Do not take double or extra doses. What may interact with this medicine? Do not take this medicine with any of the following medications:  cisapride  dronedarone  pimozide  thioridazine This medicine may also interact with the following medications:  ampicillin  antacids  cimetidine  cyclosporine  digoxin  kaolin  medicines for diabetes, like insulin, glipizide, glyburide  medicines for seizures like carbamazepine, phenobarbital, phenytoin  mefloquine  methotrexate  other medicines that prolong the QT interval (cause an abnormal  heart rhythm)  praziquantel This list may not describe all possible interactions. Give your health care provider a list of all the medicines, herbs, non-prescription drugs, or dietary supplements you use. Also tell them if you smoke, drink alcohol, or use illegal drugs. Some items may interact with your medicine. What should I watch for while using this medicine? Visit your health care professional for regular checks on your progress. Tell your health care professional if your symptoms do not start to get better or if they get worse. You may need blood work done while you are taking this medicine. If you take other medicines that can affect heart rhythm, you may need more testing. Talk to your health care professional if you have questions. Your vision may be tested before and during use of this medicine. Tell your health care professional right away if you have any change in your eyesight. What side effects may I notice from receiving this medicine? Side effects that you should report to your doctor or health care professional as soon as possible:  allergic reactions like skin rash, itching or hives, swelling of the face, lips, or tongue  changes in vision  decreased hearing or ringing of the ears  muscle weakness  redness, blistering, peeling or loosening of the skin, including inside the mouth  sensitivity to light  signs and symptoms of a dangerous change in heartbeat or heart rhythm like chest pain; dizziness; fast or irregular heartbeat; palpitations; feeling faint or lightheaded, falls; breathing problems  signs and symptoms of liver injury like dark yellow or brown urine; general ill feeling or flu-like symptoms; light-colored stools; loss of appetite; nausea; right upper belly pain; unusually weak or tired; yellowing of the eyes or skin  signs and symptoms of low blood sugar such as feeling anxious; confusion; dizziness; increased hunger; unusually weak or tired; sweating;  shakiness; cold; irritable; headache; blurred vision; fast heartbeat; loss of consciousness  suicidal thoughts  uncontrollable head, mouth, neck, arm, or leg movements Side effects that usually do not require medical attention (report to your doctor or health care professional if they continue or are bothersome):  diarrhea  dizziness  hair loss  headache  irritable  loss of appetite  nausea, vomiting  stomach pain This list may not describe all possible side effects. Call your doctor for medical advice about side effects. You may report side effects to FDA at 1-800-FDA-1088. Where should I keep my medicine? Keep out of the reach of children. Store at room temperature between 15 and 30 degrees C (59 and 86 degrees F). Protect from moisture and light. Throw away any unused medicine after the expiration date. NOTE: This sheet is a summary. It may not cover all possible information. If you have questions about this medicine, talk to your doctor, pharmacist, or health care provider.  2020 Elsevier/Gold Standard (2018-12-27 12:56:32)   COVID-19 vaccine recommendations:   COVID-19 vaccine is recommended for everyone (unless you are allergic to a vaccine component), even if you are on a medication that suppresses your immune system.   If you are on Methotrexate, Cellcept (mycophenolate), Rinvoq, Morrie Sheldon, and Olumiant- hold the medication for 1 week after each vaccine. Hold Methotrexate for 2 weeks after the single dose COVID-19 vaccine.   If you are on Orencia subcutaneous injection - hold medication one week prior to and one week after the first COVID-19 vaccine dose (only).   If you are on Orencia IV infusions- time vaccination administration so that the first COVID-19 vaccination will occur four weeks after the infusion and postpone the subsequent infusion by one week.   If you are on Cyclophosphamide or Rituxan infusions please contact your doctor prior to receiving the COVID-19  vaccine.   Do not take Tylenol or any anti-inflammatory medications (NSAIDs) 24 hours prior to the COVID-19 vaccination.   There is no direct evidence about the efficacy of the COVID-19 vaccine in individuals who are on medications that suppress the immune system.   Even if you are fully vaccinated, and you are on any medications that suppress your immune system, please continue to wear a mask, maintain at least six feet social distance and practice hand hygiene.   If you develop a COVID-19 infection, please contact your PCP or our office to determine if you need antibody infusion.  The booster vaccine is now available for immunocompromised patients. It is advised that if you had Pfizer vaccine you should get Coca[employer]a booster.  If you had a Moderna vaccine then you should get a Moderna booster. Johnson [employer] does not have a booster vaccine at this time.  Please see the following web sites for updated information.   [email or web address]://[email or web address]/[email or web address]/[email or web address]-Vaccination-Patient-Resources.[email or web address]  [email or web address]://[email or web address]/About-Us/[email or web address]-Releases/ID/1159

## 2020-06-23 LAB — COMPLETE METABOLIC PANEL WITH GFR
AG Ratio: 2.1 (calc) (ref 1.0–2.5)
ALT: 23 U/L (ref 9–46)
AST: 23 U/L (ref 10–35)
Albumin: 4.4 g/dL (ref 3.6–5.1)
Alkaline phosphatase (APISO): 79 U/L (ref 35–144)
BUN: 14 mg/dL (ref 7–25)
CO2: 22 mmol/L (ref 20–32)
Calcium: 9.3 mg/dL (ref 8.6–10.3)
Chloride: 108 mmol/L (ref 98–110)
Creat: 0.92 mg/dL (ref 0.70–1.25)
GFR, Est African American: 104 mL/min/{1.73_m2} (ref >=60)
GFR, Est Non African American: 89 mL/min/{1.73_m2} (ref >=60)
Globulin: 2.1 g/dL (calc) (ref 1.9–3.7)
Glucose, Bld: 87 mg/dL (ref 65–99)
Potassium: 4.2 mmol/L (ref 3.5–5.3)
Sodium: 137 mmol/L (ref 135–146)
Total Bilirubin: 0.5 mg/dL (ref 0.2–1.2)
Total Protein: 6.5 g/dL (ref 6.1–8.1)

## 2020-06-23 LAB — CBC WITH DIFFERENTIAL/PLATELET
Absolute Monocytes: 1110 cells/uL — ABNORMAL HIGH (ref 200–950)
Basophils Absolute: 73 cells/uL (ref 0–200)
Basophils Relative: 1 %
Eosinophils Absolute: 329 cells/uL (ref 15–500)
Eosinophils Relative: 4.5 %
HCT: 43.4 % (ref 38.5–50.0)
Hemoglobin: 14.6 g/dL (ref 13.2–17.1)
Lymphs Abs: 2219 cells/uL (ref 850–3900)
MCH: 31.5 pg (ref 27.0–33.0)
MCHC: 33.6 g/dL (ref 32.0–36.0)
MCV: 93.7 fL (ref 80.0–100.0)
MPV: 11.4 fL (ref 7.5–12.5)
Monocytes Relative: 15.2 %
Neutro Abs: 3570 cells/uL (ref 1500–7800)
Neutrophils Relative %: 48.9 %
Platelets: 240 10*3/uL (ref 140–400)
RBC: 4.63 10*6/uL (ref 4.20–5.80)
RDW: 13.1 % (ref 11.0–15.0)
Total Lymphocyte: 30.4 %
WBC: 7.3 10*3/uL (ref 3.8–10.8)

## 2020-06-23 LAB — SEDIMENTATION RATE: Sed Rate: 2 mm/h (ref 0–20)

## 2020-06-23 NOTE — Progress Notes (Signed)
CBC, CMP and sed rate are normal.

## 2020-06-25 ENCOUNTER — Encounter: Payer: Self-pay | Admitting: Rheumatology

## 2020-06-25 MED ORDER — HYDROXYCHLOROQUINE SULFATE 200 MG PO TABS: 200.0000 mg | ORAL_TABLET | Freq: Two times a day (BID) | ORAL | 0 refills | Status: DC

## 2020-06-25 MED ORDER — SULFASALAZINE 500 MG PO TABS: 1000.0000 mg | ORAL_TABLET | Freq: Two times a day (BID) | ORAL | 0 refills | Status: DC

## 2020-06-25 NOTE — Telephone Encounter (Signed)
I left a message on the answering machine for patient to call back.  Monocyte count is mildly elevated which fluctuates every month.  WBC count is normal.  This is not a concern.  We will continue to monitor labs.

## 2020-06-25 NOTE — Telephone Encounter (Signed)
Per office note on 06/22/2020: Dose will be Plaquenil 200 mg twice daily. Sulfasalazine 500 mg 2 tablets p.o. twice daily   Labs resulted: CBC, CMP and sed rate are normal

## 2020-06-26 ENCOUNTER — Encounter: Payer: Self-pay | Admitting: Rheumatology

## 2020-07-09 ENCOUNTER — Encounter: Payer: Self-pay | Admitting: Family

## 2020-07-11 MED ORDER — PROMETHAZINE HCL 25 MG PO TABS: 25.0000 mg | ORAL_TABLET | Freq: Three times a day (TID) | ORAL | 0 refills | Status: DC | PRN

## 2020-07-11 NOTE — Addendum Note (Signed)
Addended by: Debbrah Alar on: 07/11/2020 12:53 PM   Modules accepted: Orders

## 2020-07-20 NOTE — Progress Notes (Deleted)
Office Visit Note  Patient: William Neal             Date of Birth: 07-09-1956           MRN: [id number]             PCP: Debbrah Alar, NP Referring: Debbrah Alar, NP Visit Date: 08/03/2020 Occupation: @GUAROCC @  Subjective:  No chief complaint on file.   History of Present Illness: William Neal is a 64 y.o. male ***   Activities of Daily Living:  Patient reports morning stiffness for *** {minute/hour:19697}.   Patient {ACTIONS;DENIES/REPORTS:21021675::"Denies"} nocturnal pain.  Difficulty dressing/grooming: {ACTIONS;DENIES/REPORTS:21021675::"Denies"} Difficulty climbing stairs: {ACTIONS;DENIES/REPORTS:21021675::"Denies"} Difficulty getting out of chair: {ACTIONS;DENIES/REPORTS:21021675::"Denies"} Difficulty using hands for taps, buttons, cutlery, and/or writing: {ACTIONS;DENIES/REPORTS:21021675::"Denies"}  No Rheumatology ROS completed.   PMFS History:  Patient Active Problem List   Diagnosis Date Noted  . Essential hypertension 05/14/2020  . Rheumatoid arthritis ([place]) 05/14/2020  . Other malaise and fatigue 04/24/2014  . Skin cyst 04/24/2014  . Routine general medical examination at a health care facility 10/12/2012  . History of hepatitis C 10/12/2012  . History of colon polyps 10/12/2012  . BPH (benign prostatic hyperplasia) 10/12/2012    Past Medical History:  Diagnosis Date  . BPH (benign prostatic hyperplasia) 10/12/2012  . History of hepatitis C     Family History  Problem Relation Age of Onset  . Diabetes Mother   . Emphysema Father   . Hypertension Maternal Uncle   . Coronary artery disease Brother        multiple stents, 1/2 brother  . Heart disease Other 80       died in his sleep, ?presumed heart attack  . Hypertension Sister   . Colon cancer Neg Hx   . Esophageal cancer Neg Hx   . Rectal cancer Neg Hx   . Stomach cancer Neg Hx    Past Surgical History:  Procedure Laterality Date  . 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 Influenza, Quadrivalent, Recombinant, Inj, Pf 05/22/2019  . Influenza,inj,Quad PF,6+ Mos 05/29/2017  . Td 09/01/1998  . Tdap 10/12/2012     Objective: Vital Signs: There were no vitals taken for this visit.   Physical Exam   Musculoskeletal Exam: ***  CDAI Exam: CDAI Score: -- Patient Global: --; Provider Global: -- Swollen: --; Tender: -- Joint Exam 08/03/2020   No joint exam has been documented for this visit   There is currently no information documented on the homunculus. Go to the Rheumatology activity and complete the homunculus joint exam.  Investigation: No additional findings.  Imaging: XR Foot 2 Views Left  Result Date: 06/22/2020 First MTP, IP and DIP narrowing was noted.  No intertarsal, no tibiotalar or subtalar joint space narrowing was noted.  No erosive changes were noted.  Inferior calcaneal spur was noted. Impression: These findings are consistent with osteoarthritis of the foot.  XR Foot 2 Views Right  Result Date: 06/22/2020 First MTP, PIP and DIP narrowing was noted.  No intertarsal, tibiotalar or subtalar joint space narrowing was noted.  No erosive changes were noted. Impression: These findings are consistent with osteoarthritis of the foot.  XR Hand 2 View Left  Result Date: 06/22/2020 Severe CMC narrowing was noted. PIP and  DIP narrowing was noted. 3rd MCP narrowing was noted. No intercarpal radiocarpal joint space narrowing was noted. No erosive changes were noted. Juxta-articular osteopenia was noted. 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  XR Hand 2 View Right  Result Date: 06/22/2020 2nd and 3rd MCP narrowing was noted. CMC, PIP and DIP narrowing was noted. No intercarpal  radiocarpal joint space narrowing was noted. No erosive changes were noted. Juxta-articular osteopenia was noted. 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   Recent Labs: Lab Results  Component Value Date   WBC 7.3 06/22/2020   HGB 14.6 06/22/2020   PLT 240 06/22/2020   NA 137 06/22/2020   K 4.2 06/22/2020   CL 108 06/22/2020   CO2 22 06/22/2020   GLUCOSE 87 06/22/2020   BUN 14 06/22/2020   CREATININE 0.92 06/22/2020   BILITOT 0.5 06/22/2020   ALKPHOS 84 03/27/2017   AST 23 06/22/2020   ALT 23 06/22/2020   PROT 6.5 06/22/2020   ALBUMIN 4.4 03/27/2017   CALCIUM 9.3 06/22/2020   GFRAA 104 06/22/2020    Speciality Comments: PLQ Eye Exam: 12/21/17  WNL @ [facility] follow up in 6 months.   Procedures:  No procedures performed Allergies: Patient has no active allergies.   Assessment / Plan:     Visit Diagnoses: No diagnosis found.  Orders: No orders of the defined types were placed in this encounter.  No orders of the defined types were placed in this encounter.   Face-to-face time spent with patient was *** minutes. Greater than 50% of time was spent in counseling and coordination of care.  Follow-Up Instructions: No follow-ups on file.   Earnestine Mealing, CMA  Note - This record has been created using Editor, commissioning.  Chart creation errors have been sought, but may not always  have been located. Such creation errors do not reflect on  the standard of medical care.

## 2020-08-03 ENCOUNTER — Ambulatory Visit: Payer: 59 | Admitting: Rheumatology

## 2020-08-03 DIAGNOSIS — M19041 Primary osteoarthritis, right hand: Secondary | ICD-10-CM

## 2020-08-03 DIAGNOSIS — Z79899 Other long term (current) drug therapy: Secondary | ICD-10-CM

## 2020-08-03 DIAGNOSIS — M1712 Unilateral primary osteoarthritis, left knee: Secondary | ICD-10-CM

## 2020-08-03 DIAGNOSIS — Z86010 Personal history of colonic polyps: Secondary | ICD-10-CM

## 2020-08-03 DIAGNOSIS — M0579 Rheumatoid arthritis with rheumatoid factor of multiple sites without organ or systems involvement: Secondary | ICD-10-CM

## 2020-08-03 DIAGNOSIS — Z8619 Personal history of other infectious and parasitic diseases: Secondary | ICD-10-CM

## 2020-08-03 DIAGNOSIS — M19071 Primary osteoarthritis, right ankle and foot: Secondary | ICD-10-CM

## 2020-08-17 ENCOUNTER — Encounter: Payer: Self-pay | Admitting: Rheumatology

## 2020-08-27 ENCOUNTER — Ambulatory Visit: Payer: 59 | Admitting: Family

## 2020-09-03 NOTE — Progress Notes (Signed)
Office Visit Note  Patient: William Neal             Date of Birth: [birth year]           MRN: [id number]             PCP: Debbrah Alar, NP Referring: Debbrah Alar, NP Visit Date: 09/04/2020 Occupation: @GUAROCC @  Subjective:  Other (Patient reports itching, but no visible rashes. Patient tested positive for COVID in November 9th 2021, symptoms started November 5th. )   History of Present Illness: William Neal is a 65 y.o. male with history of seropositive rheumatoid arthritis and osteoarthritis.  He had been on Plaquenil and sulfasalazine combination intermittently for the last 2 months.  He had interruption of the treatment in the beginning and then in November when he developed Covid-19 infection.  He states he has been taking sulfasalazine and Plaquenil combination consistently for the last 2 months.  He is also noticed recently has been experiencing itching all over his body.  He states the itching gets worse when he starts scratching.  He denies any rash.  He continues to have discomfort in his left knee joint and right thumb.  Activities of Daily Living:  Patient reports morning stiffness for 30 minutes.   Patient Reports nocturnal pain.  Difficulty dressing/grooming: Denies Difficulty climbing stairs: Reports Difficulty getting out of chair: Reports Difficulty using hands for taps, buttons, cutlery, and/or writing: Reports  Review of Systems  Constitutional: Positive for fatigue.  HENT: Negative for mouth sores, mouth dryness and nose dryness.   Eyes: Negative for pain, itching and dryness.  Respiratory: Negative for shortness of breath and difficulty breathing.   Cardiovascular: Negative for chest pain and palpitations.  Gastrointestinal: Negative for blood in stool, constipation and diarrhea.  Endocrine: Negative for increased urination.  Genitourinary: Positive for urgency. Negative for difficulty urinating.  Musculoskeletal: Positive for arthralgias, joint  pain, joint swelling, myalgias, morning stiffness and myalgias. Negative for muscle tenderness.  Skin: Negative for color change, rash and redness.  Neurological: Positive for headaches. Negative for dizziness, numbness, memory loss and weakness.  Hematological: Positive for bruising/bleeding tendency.  Psychiatric/Behavioral: Negative for confusion.    PMFS History:  Patient Active Problem List   Diagnosis Date Noted  . Essential hypertension 05/14/2020  . Rheumatoid arthritis ([place]) 05/14/2020  . Other malaise and fatigue 04/24/2014  . Skin cyst 04/24/2014  . Routine general medical examination at a health care facility 10/12/2012  . History of hepatitis C 10/12/2012  . History of colon polyps 10/12/2012  . BPH (benign prostatic hyperplasia) 10/12/2012    Past Medical History:  Diagnosis Date  . BPH (benign prostatic hyperplasia) 10/12/2012  . History of hepatitis C     Family History  Problem Relation Age of Onset  . Diabetes Mother   . Emphysema Father   . Hypertension Maternal Uncle   . Coronary artery disease Brother        multiple stents, 1/2 brother  . Heart disease Other 80       died in his sleep, ?presumed heart attack  . Hypertension Sister   . Colon cancer Neg Hx   . Esophageal cancer Neg Hx   . Rectal cancer Neg Hx   . Stomach cancer Neg Hx    Past Surgical History:  Procedure Laterality Date  . no prior surgery     Social History   Social History Narrative   Married to [employer], [employer]   Married  2 step children   Enjoys fishing   Completed college   Immunization History  Administered Date(s) Administered  . Influenza, Quadrivalent, Recombinant, Inj, Pf 05/22/2019  . Influenza,inj,Quad PF,6+ Mos 05/29/2017  . Td 09/01/1998  . Tdap 10/12/2012     Objective: Vital Signs: BP 126/79 (BP Location: Left Arm, Patient Position: Sitting, Cuff Size: Normal)   Pulse 84   Resp 16   Ht 5' 11"  (1.803 m)   Wt 207 lb (93.9 kg)    BMI 28.87 kg/m    Physical Exam Vitals and nursing note reviewed.  Constitutional:      Appearance: He is well-developed and well-nourished.  HENT:     Head: Normocephalic and atraumatic.  Eyes:     Extraocular Movements: EOM normal.     Conjunctiva/sclera: Conjunctivae normal.     Pupils: Pupils are equal, round, and reactive to light.  Cardiovascular:     Rate and Rhythm: Normal rate and regular rhythm.     Heart sounds: Normal heart sounds.  Pulmonary:     Effort: Pulmonary effort is normal.     Breath sounds: Normal breath sounds.  Abdominal:     General: Bowel sounds are normal.     Palpations: Abdomen is soft.  Musculoskeletal:     Cervical back: Normal range of motion and neck supple.  Skin:    General: Skin is warm and dry.     Capillary Refill: Capillary refill takes less than 2 seconds.  Neurological:     Mental Status: He is alert and oriented to person, place, and time.  Psychiatric:        Mood and Affect: Mood and affect normal.        Behavior: Behavior normal.      Musculoskeletal Exam: C-spine was in good range of motion.  Shoulder joints, elbow joints, wrist joints, MCPs PIPs and DIPs with good range of motion.  He tenderness over right CMC joint.  PIP and DIP thickening with no synovitis was noted.  Hip joints and knee joints with good range of motion.  He has warmth swelling and effusion in his left knee joint.  There is no tenderness over MTPs or PIPs.  CDAI Exam: CDAI Score: 3  Patient Global: 5 mm; Provider Global: 5 mm Swollen: 1 ; Tender: 1  Joint Exam 09/04/2020      Right  Left  Knee     Swollen Tender     Investigation: No additional findings.  Imaging: No results found.  Recent Labs: Lab Results  Component Value Date   WBC 7.3 06/22/2020   HGB 14.6 06/22/2020   PLT 240 06/22/2020   NA 137 06/22/2020   K 4.2 06/22/2020   CL 108 06/22/2020   CO2 22 06/22/2020   GLUCOSE 87 06/22/2020   BUN 14 06/22/2020   CREATININE 0.92  06/22/2020   BILITOT 0.5 06/22/2020   ALKPHOS 84 03/27/2017   AST 23 06/22/2020   ALT 23 06/22/2020   PROT 6.5 06/22/2020   ALBUMIN 4.4 03/27/2017   CALCIUM 9.3 06/22/2020   GFRAA 104 06/22/2020    Speciality Comments: PLQ Eye Exam: 08/17/2020 WNL @ [facility] follow up in 6 months.   Procedures:  Large Joint Inj: L knee on 09/04/2020 2:30 PM Indications: pain Details: 27 G 1.5 in needle, medial approach  Arthrogram: No  Medications: 3 mL lidocaine 1 %; 60 mg triamcinolone acetonide 40 MG/ML Aspirate: 27 mL clear Outcome: tolerated well, no immediate complications Procedure, treatment alternatives, risks  and benefits explained, specific risks discussed. Consent was given by the patient. Immediately prior to procedure a time out was called to verify the correct patient, procedure, equipment, support staff and site/side marked as required. Patient was prepped and draped in the usual sterile fashion.     Allergies: Patient has no active allergies.   Assessment / Plan:     Visit Diagnoses: Rheumatoid arthritis with rheumatoid factor of multiple sites without organ or systems involvement (HCC) - RF 35, anti-CCP negative, 14 33 eta negative, ESR normal. X-ray of bilateral hands showed second and third MCP narrowing: He has been on sulfasalazine and Plaquenil.  He states that he has not noticed any improvement in his symptoms.  He has been experiencing discomfort in his left knee joint which was swollen on my examination today.  He also complains of discomfort in his right [facility] joint which is due to osteoarthritis.  We had detailed discussion regarding treatment options.  We decided to take him off sulfasalazine and try Plaquenil monotherapy to see if the pruritus improves.  He does not want any other immunosuppressive therapy.  High risk medication use - PLQ 250m BID and SSZ 1,0013mBID. PLQ Eye Exam: 08/17/2020  - Plan: CBC with Differential/Platelet, COMPLETE METABOLIC PANEL WITH  GFR today and then every 3 months.  Pruritus-most likely due to sulfasalazine.  He will discontinue sulfasalazine.  Primary osteoarthritis of both hands - X-ray of bilateral hands were consistent with osteoarthritis and rheumatoid arthritis overlap.  He had no synovitis in his hands on my examination.  The discomfort was mostly over right CMC joint.  Have given him a prescription for right CMC brace.  Effusion, left knee -he had warmth and swelling in his left knee joint.  After informed consent was obtained left knee joint was prepped in sterile fashion.  Synovial fluid was aspirated and injected.  Plan: Synovial Fluid Analysis, Complete, Anaerobic and Aerobic Culture  Primary osteoarthritis of left knee-he continues to have some discomfort in his left knee joint.  Primary osteoarthritis of both feet -  X-rays were consistent with osteoarthritis.  History of colon polyps  History of hepatitis C - Treated.  Repeat Hep C testing negative.  COVID-19 virus infection-patient had COVID-19 virus infection in November.  He has completely recovered but continues to have some fatigue.  We had detailed discussion regarding getting vaccination.  Instructions were placed in the AVS.  Use of mask, social distancing and hand hygiene was discussed.  Orders: Orders Placed This Encounter  Procedures  . Large Joint Inj  . Anaerobic and Aerobic Culture  . CBC with Differential/Platelet  . COMPLETE METABOLIC PANEL WITH GFR  . Synovial Fluid Analysis, Complete   No orders of the defined types were placed in this encounter.    Follow-Up Instructions: Return in about 6 weeks (around 10/16/2020) for Rheumatoid arthritis, Osteoarthritis.   ShBo MerinoMD  Note - This record has been created using DrEditor, commissioning Chart creation errors have been sought, but may not always  have been located. Such creation errors do not reflect on  the standard of medical care.

## 2020-09-04 ENCOUNTER — Other Ambulatory Visit: Payer: Self-pay

## 2020-09-04 ENCOUNTER — Encounter: Payer: Self-pay | Admitting: Rheumatology

## 2020-09-04 ENCOUNTER — Ambulatory Visit (INDEPENDENT_AMBULATORY_CARE_PROVIDER_SITE_OTHER): Payer: 59 | Admitting: Rheumatology

## 2020-09-04 VITALS — BP 126/79 | HR 84 | Resp 16 | Ht 71.0 in | Wt 207.0 lb

## 2020-09-04 DIAGNOSIS — M19072 Primary osteoarthritis, left ankle and foot: Secondary | ICD-10-CM

## 2020-09-04 DIAGNOSIS — M0579 Rheumatoid arthritis with rheumatoid factor of multiple sites without organ or systems involvement: Secondary | ICD-10-CM

## 2020-09-04 DIAGNOSIS — M19071 Primary osteoarthritis, right ankle and foot: Secondary | ICD-10-CM

## 2020-09-04 DIAGNOSIS — M19042 Primary osteoarthritis, left hand: Secondary | ICD-10-CM

## 2020-09-04 DIAGNOSIS — M19041 Primary osteoarthritis, right hand: Secondary | ICD-10-CM

## 2020-09-04 DIAGNOSIS — L299 Pruritus, unspecified: Secondary | ICD-10-CM | POA: Diagnosis not present

## 2020-09-04 DIAGNOSIS — Z8619 Personal history of other infectious and parasitic diseases: Secondary | ICD-10-CM

## 2020-09-04 DIAGNOSIS — U071 COVID-19: Secondary | ICD-10-CM

## 2020-09-04 DIAGNOSIS — M25462 Effusion, left knee: Secondary | ICD-10-CM | POA: Diagnosis not present

## 2020-09-04 DIAGNOSIS — Z79899 Other long term (current) drug therapy: Secondary | ICD-10-CM | POA: Diagnosis not present

## 2020-09-04 DIAGNOSIS — M1712 Unilateral primary osteoarthritis, left knee: Secondary | ICD-10-CM

## 2020-09-04 DIAGNOSIS — Z86010 Personal history of colonic polyps: Secondary | ICD-10-CM

## 2020-09-04 MED ORDER — TRIAMCINOLONE ACETONIDE 40 MG/ML IJ SUSP
60.0000 mg | INTRAMUSCULAR | Status: AC | PRN
  Administered 2020-09-04: 60 mg via INTRA_ARTICULAR

## 2020-09-04 MED ORDER — LIDOCAINE HCL 1 % IJ SOLN
3.0000 mL | INTRAMUSCULAR | Status: AC | PRN
  Administered 2020-09-04: 3 mL

## 2020-09-04 NOTE — Patient Instructions (Signed)
Standing Labs We placed an order today for your standing lab work.   Please have your standing labs drawn in April  If possible, please have your labs drawn 2 weeks prior to your appointment so that the provider can discuss your results at your appointment.  We have open lab daily Monday through Thursday from 8:30-12:30 PM and 1:30-4:30 PM and Friday from 8:30-12:30 PM and 1:30-4:00 PM at the office of Dr. Cherlyn Syring, [facility].   Please be advised, patients with office appointments requiring lab work will take precedents over walk-in lab work.  If possible, please come for your lab work on Monday and Friday afternoons, as you may experience shorter wait times. The office is located at [address], [address], [place], [place] [address] No appointment is necessary.   Labs are drawn by [facility]. Please bring your co-pay at the time of your lab draw.  You may receive a bill from [facility] for your lab work.  If you wish to have your labs drawn at another location, please call the office 24 hours in advance to send orders.  If you have any questions regarding directions or hours of operation,  please call [phone].   As a reminder, please drink plenty of water prior to coming for your lab work. Thanks!  COVID-19 vaccine recommendations:   COVID-19 vaccine is recommended for everyone (unless you are allergic to a vaccine component), even if you are on a medication that suppresses your immune system.   Do not take Tylenol or any anti-inflammatory medications (NSAIDs) 24 hours prior to the COVID-19 vaccination.   There is no direct evidence about the efficacy of the COVID-19 vaccine in individuals who are on medications that suppress the immune system.   Even if you are fully vaccinated, and you are on any medications that suppress your immune system, please continue to wear a mask, maintain at least six feet social distance and practice hand hygiene.   If you develop a  COVID-19 infection, please contact your PCP or our office to determine if you need monoclonal antibody infusion.  The booster vaccine is now available for immunocompromised patients.   Please see the following web sites for updated information.   [email or web address]     

## 2020-09-05 LAB — COMPLETE METABOLIC PANEL WITH GFR
AG Ratio: 1.9 (calc) (ref 1.0–2.5)
ALT: 16 U/L (ref 9–46)
AST: 20 U/L (ref 10–35)
Albumin: 4.6 g/dL (ref 3.6–5.1)
Alkaline phosphatase (APISO): 84 U/L (ref 35–144)
BUN: 12 mg/dL (ref 7–25)
CO2: 27 mmol/L (ref 20–32)
Calcium: 10 mg/dL (ref 8.6–10.3)
Chloride: 106 mmol/L (ref 98–110)
Creat: 1 mg/dL (ref 0.70–1.25)
GFR, Est African American: 93 mL/min/{1.73_m2} (ref >=60)
GFR, Est Non African American: 80 mL/min/{1.73_m2} (ref >=60)
Globulin: 2.4 g/dL (calc) (ref 1.9–3.7)
Glucose, Bld: 80 mg/dL (ref 65–99)
Potassium: 4.5 mmol/L (ref 3.5–5.3)
Sodium: 142 mmol/L (ref 135–146)
Total Bilirubin: 0.4 mg/dL (ref 0.2–1.2)
Total Protein: 7 g/dL (ref 6.1–8.1)

## 2020-09-05 LAB — CBC WITH DIFFERENTIAL/PLATELET
Absolute Monocytes: 1183 cells/uL — ABNORMAL HIGH (ref 200–950)
Basophils Absolute: 73 cells/uL (ref 0–200)
Basophils Relative: 1 %
Eosinophils Absolute: 168 cells/uL (ref 15–500)
Eosinophils Relative: 2.3 %
HCT: 43.4 % (ref 38.5–50.0)
Hemoglobin: 14.8 g/dL (ref 13.2–17.1)
Lymphs Abs: 1445 cells/uL (ref 850–3900)
MCH: 32.7 pg (ref 27.0–33.0)
MCHC: 34.1 g/dL (ref 32.0–36.0)
MCV: 96 fL (ref 80.0–100.0)
MPV: 11.4 fL (ref 7.5–12.5)
Monocytes Relative: 16.2 %
Neutro Abs: 4431 cells/uL (ref 1500–7800)
Neutrophils Relative %: 60.7 %
Platelets: 213 10*3/uL (ref 140–400)
RBC: 4.52 10*6/uL (ref 4.20–5.80)
RDW: 12.9 % (ref 11.0–15.0)
Total Lymphocyte: 19.8 %
WBC: 7.3 10*3/uL (ref 3.8–10.8)

## 2020-09-05 NOTE — Progress Notes (Signed)
CBC and CMP normal

## 2020-09-05 NOTE — Progress Notes (Signed)
Synovial fluid is not inflammatory and did not show any crystals.

## 2020-09-09 NOTE — Progress Notes (Signed)
Synovial fluid culture is negative.

## 2020-09-10 LAB — ANAEROBIC AND AEROBIC CULTURE
AER RESULT:: NO GROWTH
GRAM STAIN:: NONE SEEN
MICRO NUMBER:: 11383238
MICRO NUMBER:: 11383239
SPECIMEN QUALITY:: ADEQUATE
SPECIMEN QUALITY:: ADEQUATE

## 2020-09-10 LAB — SYNOVIAL FLUID ANALYSIS, COMPLETE
Basophils, %: 0 %
Eosinophils-Synovial: 0 % (ref 0–2)
Lymphocytes-Synovial Fld: 65 % (ref 0–74)
Monocyte/Macrophage: 34 % (ref 0–69)
Neutrophil, Synovial: 1 % (ref 0–24)
Synoviocytes, %: 0 % (ref 0–15)
WBC, Synovial: 552 cells/uL — ABNORMAL HIGH (ref <150)

## 2020-10-12 ENCOUNTER — Encounter: Payer: Self-pay | Admitting: Family

## 2020-10-12 DIAGNOSIS — Z1211 Encounter for screening for malignant neoplasm of colon: Secondary | ICD-10-CM

## 2020-10-15 ENCOUNTER — Other Ambulatory Visit: Payer: Self-pay | Admitting: Family

## 2020-10-15 ENCOUNTER — Encounter: Payer: Self-pay | Admitting: Gastroenterology

## 2020-10-23 ENCOUNTER — Other Ambulatory Visit: Payer: Self-pay

## 2020-10-23 ENCOUNTER — Ambulatory Visit: Payer: 59

## 2020-10-23 ENCOUNTER — Telehealth: Payer: Self-pay

## 2020-10-23 NOTE — Telephone Encounter (Signed)
Attempted to reach patient - unable to reach- [facility] for pre visit appt; patient will need to call back and reschedule PV and procedure appts if RN unable to reach him for PV appt;

## 2020-10-25 ENCOUNTER — Ambulatory Visit (AMBULATORY_SURGERY_CENTER): Payer: 59

## 2020-10-25 ENCOUNTER — Telehealth: Payer: Self-pay

## 2020-10-25 ENCOUNTER — Other Ambulatory Visit: Payer: Self-pay

## 2020-10-25 VITALS — Ht 71.0 in | Wt 205.0 lb

## 2020-10-25 DIAGNOSIS — Z01818 Encounter for other preprocedural examination: Secondary | ICD-10-CM

## 2020-10-25 DIAGNOSIS — Z86010 Personal history of colonic polyps: Secondary | ICD-10-CM

## 2020-10-25 MED ORDER — PLENVU 140 G PO SOLR: 1.0000 | ORAL | 0 refills | Status: DC

## 2020-10-25 NOTE — Progress Notes (Signed)
  Pt verified name, DOB, address and insurance during PV today. Pt mailed instruction packet to included paper to complete and mail back to [facility] with addressed and stamped envelope, Emmi video, copy of consent form to read and not return, and instructions. Plenvu coupon mailed in packet. PV completed over the phone. Pt encouraged to call with questions or issues    No allergies to soy or egg Pt is not on blood thinners or diet pills Denies issues with sedation/intubation Denies atrial flutter/fib Denies constipation   Emmi instructions given to pt  Pt is aware of Covid safety and care partner requirements.

## 2020-10-25 NOTE — Telephone Encounter (Signed)
Attempted to reach pt for virtual previsit with call going to VM.  2nd attempt to reach pt for virtual previsit call when to VM.  Requested pt to call back by 5:00 today to schedule PV and avoid cancellation of procedure.

## 2020-10-25 NOTE — Progress Notes (Signed)
Office Visit Note  Patient: William Neal             Date of Birth: 1955-12-23           MRN: [id number]             PCP: Debbrah Alar, NP Referring: Debbrah Alar, NP Visit Date: 11/08/2020 Occupation: @GUAROCC @  Subjective:  Medication management   History of Present Illness: William Neal is a 65 y.o. male with history of seropositive rheumatoid arthritis.  He states he continues to have pain and discomfort in his joints.  He came on September 04, 2020 4 with left knee joint effusion.  His left knee joint was aspirated and injected with cortisone.  He states he he had ongoing pain for the next 2 weeks and then eventually improved.  He continues to have pain and discomfort in his right hand which he describes over the right [facility] joint.  It is difficult for him to wear a brace at work.  Gradually the symptoms have improved and he is not having so much discomfort today.  He continues to have grinding sensation in his left knee joint.  He states he has to climb ladders all day and sometimes has to be kneeling at work.  Activities of Daily Living:  Patient reports morning stiffness for 30 minutes.   Patient Reports nocturnal pain.  Difficulty dressing/grooming: Reports Difficulty climbing stairs: Reports Difficulty getting out of chair: Reports Difficulty using hands for taps, buttons, cutlery, and/or writing: Reports  Review of Systems  Constitutional: Positive for fatigue. Negative for night sweats.  HENT: Positive for nose dryness. Negative for mouth sores and mouth dryness.   Eyes: Negative for pain, redness, itching, visual disturbance and dryness.  Respiratory: Negative for cough, hemoptysis, shortness of breath and difficulty breathing.   Cardiovascular: Negative for chest pain, palpitations, hypertension, irregular heartbeat and swelling in legs/feet.  Gastrointestinal: Negative for abdominal pain, blood in stool, constipation and diarrhea.  Endocrine: Negative for  increased urination.  Genitourinary: Negative for painful urination.  Musculoskeletal: Positive for arthralgias, joint pain, joint swelling and morning stiffness. Negative for myalgias, muscle weakness, muscle tenderness and myalgias.  Skin: Negative for color change, rash, hair loss, nodules/bumps, redness, skin tightness, ulcers and sensitivity to sunlight.  Allergic/Immunologic: Negative for susceptible to infections.  Neurological: Positive for headaches and memory loss. Negative for dizziness, fainting, numbness, night sweats and weakness.  Hematological: Negative for swollen glands.  Psychiatric/Behavioral: Positive for sleep disturbance. Negative for depressed mood and confusion. The patient is not nervous/anxious.     PMFS History:  Patient Active Problem List   Diagnosis Date Noted   Essential hypertension 05/14/2020   Rheumatoid arthritis ([place]) 05/14/2020   Other malaise and fatigue 04/24/2014   Skin cyst 04/24/2014   Routine general medical examination at a health care facility 10/12/2012   History of hepatitis C 10/12/2012   History of colon polyps 10/12/2012   BPH (benign prostatic hyperplasia) 10/12/2012    Past Medical History:  Diagnosis Date   Arthritis    RA and Osteo   BPH (benign prostatic hyperplasia) 10/12/2012   History of hepatitis C    Hypertension     Family History  Problem Relation Age of Onset   Diabetes Mother    Emphysema Father    Hypertension Maternal Uncle    Coronary artery disease Brother        multiple stents, 1/2 brother   Heart disease Other 69       died  in his sleep, ?presumed heart attack   Hypertension Sister    Colon cancer Neg Hx    Esophageal cancer Neg Hx    Rectal cancer Neg Hx    Stomach cancer Neg Hx    Colon polyps Neg Hx    Past Surgical History:  Procedure Laterality Date   COLONOSCOPY     no prior surgery     POLYPECTOMY     Social History   Social History Narrative   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  Constitutional:      Appearance: He is well-developed.  HENT:     Head: Normocephalic and atraumatic.  Eyes:     Conjunctiva/sclera: Conjunctivae normal.     Pupils: Pupils are equal, round, and reactive to light.  Cardiovascular:     Rate and Rhythm: Normal rate and regular rhythm.     Heart sounds: Normal heart sounds.  Pulmonary:     Effort: Pulmonary effort is normal.     Breath sounds: Normal breath sounds.  Abdominal:     General: Bowel sounds are normal.     Palpations: Abdomen is soft.  Musculoskeletal:     Cervical back: Normal range of motion and neck supple.  Skin:    General: Skin is warm and dry.     Capillary Refill: Capillary refill takes less than 2 seconds.  Neurological:     Mental Status: He is alert and oriented to person, place, and time.  Psychiatric:        Behavior: Behavior normal.      Musculoskeletal Exam: C-spine was in good range of motion.  Shoulder joints, elbow joints, wrist joints, MCPs PIPs and DIPs with good range of motion.  He had bilateral CMC PIP and DIP thickening with no synovitis.  Hip joints and knee joints in good range of motion.  He has warmth and a small effusion in his left knee joint.  Right knee joint was in good range of motion.  There was no tenderness over ankles or MTPs.  CDAI Exam: CDAI Score: 2.8  Patient Global: 4 mm; Provider Global: 4 mm Swollen: 1 ; Tender: 1  Joint  Exam 11/08/2020      Right  Left  Knee     Swollen Tender     Investigation: No additional findings.  Imaging: XR KNEE 3 VIEW LEFT  Result Date: 11/08/2020 Mild to moderate medial compartment narrowing and moderate patellofemoral narrowing was noted.  No chondrocalcinosis was noted. Impression: These findings are consistent with moderate osteoarthritis and moderate chondromalacia patella.   Recent Labs: Lab Results  Component Value Date   WBC 7.3 09/04/2020   HGB 14.8 09/04/2020   PLT 213 09/04/2020   NA 142 09/04/2020   K 4.5 09/04/2020   CL 106 09/04/2020   CO2 27 09/04/2020   GLUCOSE 80 09/04/2020   BUN 12  09/04/2020   CREATININE 1.00 09/04/2020   BILITOT 0.4 09/04/2020   ALKPHOS 84 03/27/2017   AST 20 09/04/2020   ALT 16 09/04/2020   PROT 7.0 09/04/2020   ALBUMIN 4.4 03/27/2017   CALCIUM 10.0 09/04/2020   GFRAA 93 09/04/2020   March 27, 2017 hepatitis C RNA quant -nondetected, October 29, 2017 hepatitis B-, SPEP negative,   Speciality Comments: PLQ Eye Exam: 08/17/2020 WNL @ [facility] follow up in 6 months.   Procedures:  No procedures performed Allergies: Patient has no known allergies.   Assessment / Plan:     Visit Diagnoses: Rheumatoid arthritis with rheumatoid factor of multiple sites without organ or systems involvement ([place]) - RF 35, anti-CCP negative, 14 33 eta negative, ESR normal. X-ray of bilateral hands showed second and third MCP narrowing: Patient states that his left knee joint improved after the aspiration for couple of weeks and then gradually the pain has come back.  He has some warmth and inflammation on palpation today.  The synovial fluid obtained was not inflammatory.  The crystals were negative.  And the culture was negative.  None of the other joints are swollen.  I detailed discussion regarding treatment options.  He had hepatitis C in the past but after treatment RNA quantitative was undetected.  After reviewing indications side  effects contraindications were decided to hold off treatment.  He states he will discuss this further with his PCP.  He would also like a referral to orthopedics for evaluation.  High risk medication use - PLQ 259m BID and SSZ 1,0063mBID. PLQ Eye Exam: 08/17/2020.  His labs have been stable.  He will get labs in April and then every 3 months to monitor for drug toxicity.  Primary osteoarthritis of both hands - X-ray of bilateral hands were consistent with osteoarthritis and rheumatoid arthritis overlap.  He continues to have pain and discomfort in his hands.  He had no synovitis on examination.  Pain is mostly over CMC joints.  He states he is unable to wear braces at work.  Chronic pain of left knee -he has been having pain and discomfort in his left knee joint.  Some warmth was noted and a small effusion was noted today.  Plan: XR KNEE 3 VIEW LEFT.  X-ray showed moderate osteoarthritis and moderate chondromalacia patella.  No chondrocalcinosis was noted.  No radiographic progression was noted.  Per patient's request I will refer him to orthopedic surgery.  Effusion, left knee  Primary osteoarthritis of left knee  Primary osteoarthritis of both feet - X-rays were consistent with osteoarthritis.  History of colon polyps-he had recent colonoscopy.  History of hepatitis C - Treated.  Hepatitis C RNA quant was undetectable in the past.   COVID-19 virus infection-he had COVID-19 virus infection in November 2021.  He will consider vaccination in the future.  Orders: Orders Placed This Encounter  Procedures   XR KNEE 3 VIEW LEFT   Ambulatory referral to Orthopedic Surgery   No orders of the defined types were placed in this encounter.    Follow-Up Instructions: Return in about 3 months (around 02/08/2021) for Rheumatoid arthritis, Osteoarthritis.   ShBo MerinoMD  Note - This record has been created using DrEditor, commissioning Chart creation errors have been sought, but may not  always  have been located. Such creation errors do not reflect on  the standard of medical care.

## 2020-10-25 NOTE — Telephone Encounter (Signed)
Pt returned call and PV was completed

## 2020-10-29 NOTE — Telephone Encounter (Signed)
PV rescheduled and completed on 10/25/2020;

## 2020-11-01 ENCOUNTER — Other Ambulatory Visit: Payer: Self-pay | Admitting: Gastroenterology

## 2020-11-01 ENCOUNTER — Encounter: Payer: Self-pay | Admitting: Gastroenterology

## 2020-11-01 LAB — SARS CORONAVIRUS 2 (TAT 6-24 HRS): SARS Coronavirus 2: NEGATIVE

## 2020-11-06 ENCOUNTER — Encounter: Payer: Self-pay | Admitting: Gastroenterology

## 2020-11-06 ENCOUNTER — Other Ambulatory Visit: Payer: Self-pay

## 2020-11-06 ENCOUNTER — Ambulatory Visit (AMBULATORY_SURGERY_CENTER): Payer: 59 | Admitting: Gastroenterology

## 2020-11-06 ENCOUNTER — Ambulatory Visit: Payer: 59 | Admitting: Rheumatology

## 2020-11-06 VITALS — BP 125/63 | HR 56 | Temp 97.3°F | Resp 18 | Ht 71.0 in | Wt 205.0 lb

## 2020-11-06 DIAGNOSIS — D126 Benign neoplasm of colon, unspecified: Secondary | ICD-10-CM

## 2020-11-06 DIAGNOSIS — D122 Benign neoplasm of ascending colon: Secondary | ICD-10-CM

## 2020-11-06 DIAGNOSIS — D123 Benign neoplasm of transverse colon: Secondary | ICD-10-CM | POA: Diagnosis not present

## 2020-11-06 DIAGNOSIS — Z86010 Personal history of colonic polyps: Secondary | ICD-10-CM | POA: Diagnosis present

## 2020-11-06 MED ORDER — SODIUM CHLORIDE 0.9 % IV SOLN
500.0000 mL | INTRAVENOUS | Status: DC
Start: 2020-11-06 — End: 2023-01-06

## 2020-11-06 NOTE — Progress Notes (Signed)
pt tolerated well. VSS. awake and to recovery. Report given to RN.  

## 2020-11-06 NOTE — Progress Notes (Signed)
Vitals by CW 

## 2020-11-06 NOTE — Patient Instructions (Addendum)
YOU HAD AN ENDOSCOPIC PROCEDURE TODAY AT [facility]:   Refer to the procedure report that was given to you for any specific questions about what was found during the examination.  If the procedure report does not answer your questions, please call your gastroenterologist to clarify.  If you requested that your care partner not be given the details of your procedure findings, then the procedure report has been included in a sealed envelope for you to review at your convenience later.  YOU SHOULD EXPECT: Some feelings of bloating in the abdomen. Passage of more gas than usual.  Walking can help get rid of the air that was put into your GI tract during the procedure and reduce the bloating. If you had a lower endoscopy (such as a colonoscopy or flexible sigmoidoscopy) you may notice spotting of blood in your stool or on the toilet paper. If you underwent a bowel prep for your procedure, you may not have a normal bowel movement for a few days.  Please Note:  You might notice some irritation and congestion in your nose or some drainage.  This is from the oxygen used during your procedure.  There is no need for concern and it should clear up in a day or so.  SYMPTOMS TO REPORT IMMEDIATELY:   Following lower endoscopy (colonoscopy or flexible sigmoidoscopy):  Excessive amounts of blood in the stool  Significant tenderness or worsening of abdominal pains  Swelling of the abdomen that is new, acute  Fever of 100F or higher   For urgent or emergent issues, a gastroenterologist can be reached at any hour by calling [phone]. Do not use MyChart messaging for urgent concerns.    DIET:  We do recommend a small meal at first, but then you may proceed to your regular diet.  Drink plenty of fluids but you should avoid alcoholic beverages for 24 hours.  MEDICATIONS: Continue present medications. No ASA or NSAIDS for 2 weeks.  Follow up: Likely repeat colonoscopy in 3-6  months.  Please see handouts given to you by your recovery nurse.  Thank you for allowing [place] to provide for your healthcare needs today.  ACTIVITY:  You should plan to take it easy for the rest of today and you should NOT DRIVE or use heavy machinery until tomorrow (because of the sedation medicines used during the test).    FOLLOW UP: Our staff will call the number listed on your records 48-72 hours following your procedure to check on you and address any questions or concerns that you may have regarding the information given to you following your procedure. If we do not reach you, we will leave a message.  We will attempt to reach you two times.  During this call, we will ask if you have developed any symptoms of COVID 19. If you develop any symptoms (ie: fever, flu-like symptoms, shortness of breath, cough etc.) before then, please call [phone].  If you test positive for Covid 19 in the 2 weeks post procedure, please call and report this information to [place].    If any biopsies were taken you will be contacted by phone or by letter within the next 1-3 weeks.  Please call us at [phone] if you have not heard about the biopsies in 3 weeks.    SIGNATURES/CONFIDENTIALITY: You and/or your care partner have signed paperwork which will be entered into your electronic medical record.  These signatures attest to the fact that that the information above  on your After Visit Summary has been reviewed and is understood.  Full responsibility of the confidentiality of this discharge information lies with you and/or your care-partner.

## 2020-11-06 NOTE — Progress Notes (Signed)
Called to room to assist during endoscopic procedure.  Patient ID and intended procedure confirmed with present staff. Received instructions for my participation in the procedure from the performing physician.  

## 2020-11-06 NOTE — Op Note (Signed)
[facility] Patient Name: William Neal Procedure Date: 11/06/2020 7:59 AM MRN: [id number] Endoscopist: Milus Banister , MD Age: 65 Referring MD:  Date of Birth: February 20, 1956 Gender: Male Account #: [id number] Procedure:                Colonoscopy Indications:              High risk colon cancer surveillance: Personal                            history of colonic polyps; Colonoscopy 2014 three                            polyps removed, two >1cm, one was TVA, one was                            removed piecemeal (splenic flexure, site labeled                            with spot) Medicines:                Monitored Anesthesia Care Procedure:                Pre-Anesthesia Assessment:                           - Prior to the procedure, a History and Physical                            was performed, and patient medications and                            allergies were reviewed. The patient's tolerance of                            previous anesthesia was also reviewed. The risks                            and benefits of the procedure and the sedation                            options and risks were discussed with the patient.                            All questions were answered, and informed consent                            was obtained. Prior Anticoagulants: The patient has                            taken no previous anticoagulant or antiplatelet                            agents. ASA Grade Assessment: II - A patient with  mild systemic disease. After reviewing the risks                            and benefits, the patient was deemed in                            satisfactory condition to undergo the procedure.                           After obtaining informed consent, the colonoscope                            was passed under direct vision. Throughout the                            procedure, the patient's blood pressure, pulse, and                             oxygen saturations were monitored continuously. The                            Colonoscope was introduced through the anus and                            advanced to the the cecum, identified by                            appendiceal orifice and ileocecal valve. The                            colonoscopy was performed without difficulty. The                            patient tolerated the procedure well. The quality                            of the bowel preparation was good. The ileocecal                            valve, appendiceal orifice, and rectum were                            photographed. Scope In: 8:02:21 AM Scope Out: 8:28:37 AM Scope Withdrawal Time: 0 hours 22 minutes 48 seconds  Total Procedure Duration: 0 hours 26 minutes 16 seconds  Findings:                 Three sessile polyps were found in the transverse                            colon and ascending colon. The polyps were 6 to 9                            mm in size. These polyps were removed with a cold  snare. Resection and retrieval were complete.                           A 30 mm polyp was found in the splenic flexure. The                            polyp was sessile. The polyp was removed with a                            saline injection-lift technique using snare cautery                            in a piecemeal fashion. Resection and retrieval                            were complete.                           There were 3 other small (subCM) adenomatous                            appearing polyps in the left colon which I did not                            remove given length of procedure. Will plan to                            remove them at 3-6 month follow up colonoscopy.                           Internal hemorrhoids were found. The hemorrhoids                            were small.                           The exam was otherwise without abnormality on                             direct and retroflexion views. Complications:            No immediate complications. Estimated blood loss:                            None. Estimated Blood Loss:     Estimated blood loss: none. Impression:               - Three 6 to 9 mm polyps in the transverse colon                            and in the ascending colon, removed with a cold                            snare. Resected and retrieved.                           -  One 30 mm polyp at the splenic flexure, removed                            using injection-lift, snare cautery with piecemeal                            resection. Resected and retrieved.                           - There were 3 other small (subCM) adenomatous                            appearing polyps in the left colon which I did not                            remove given length of procedure. Will plan to                            remove them at 3-6 month follow up colonoscopy.                           - Internal hemorrhoids.                           - The examination was otherwise normal on direct                            and retroflexion views. Recommendation:           - Patient has a contact number available for                            emergencies. The signs and symptoms of potential                            delayed complications were discussed with the                            patient. Return to normal activities tomorrow.                            Written discharge instructions were provided to the                            patient.                           - Resume previous diet.                           - Continue present medications. No ASA or NSAIDs                            for 2 weeks.                           -  Await pathology results. Likely repeat                            colonoscopy in 3-6 months. Milus Banister, MD 11/06/2020 9:04:13 AM This report has been signed electronically.

## 2020-11-08 ENCOUNTER — Encounter: Payer: Self-pay | Admitting: Rheumatology

## 2020-11-08 ENCOUNTER — Telehealth: Payer: Self-pay

## 2020-11-08 ENCOUNTER — Other Ambulatory Visit: Payer: Self-pay

## 2020-11-08 ENCOUNTER — Ambulatory Visit: Payer: Self-pay

## 2020-11-08 ENCOUNTER — Ambulatory Visit (INDEPENDENT_AMBULATORY_CARE_PROVIDER_SITE_OTHER): Payer: 59 | Admitting: Rheumatology

## 2020-11-08 ENCOUNTER — Telehealth: Payer: Self-pay | Admitting: *Deleted

## 2020-11-08 VITALS — BP 136/81 | HR 56 | Ht 71.0 in | Wt 210.4 lb

## 2020-11-08 DIAGNOSIS — Z860100 Personal history of colon polyps, unspecified: Secondary | ICD-10-CM

## 2020-11-08 DIAGNOSIS — M25462 Effusion, left knee: Secondary | ICD-10-CM

## 2020-11-08 DIAGNOSIS — L299 Pruritus, unspecified: Secondary | ICD-10-CM

## 2020-11-08 DIAGNOSIS — G8929 Other chronic pain: Secondary | ICD-10-CM | POA: Diagnosis not present

## 2020-11-08 DIAGNOSIS — U071 COVID-19: Secondary | ICD-10-CM

## 2020-11-08 DIAGNOSIS — M19071 Primary osteoarthritis, right ankle and foot: Secondary | ICD-10-CM

## 2020-11-08 DIAGNOSIS — Z8619 Personal history of other infectious and parasitic diseases: Secondary | ICD-10-CM

## 2020-11-08 DIAGNOSIS — M19041 Primary osteoarthritis, right hand: Secondary | ICD-10-CM | POA: Diagnosis not present

## 2020-11-08 DIAGNOSIS — M0579 Rheumatoid arthritis with rheumatoid factor of multiple sites without organ or systems involvement: Secondary | ICD-10-CM | POA: Diagnosis not present

## 2020-11-08 DIAGNOSIS — Z79899 Other long term (current) drug therapy: Secondary | ICD-10-CM | POA: Diagnosis not present

## 2020-11-08 DIAGNOSIS — M1712 Unilateral primary osteoarthritis, left knee: Secondary | ICD-10-CM

## 2020-11-08 DIAGNOSIS — M19042 Primary osteoarthritis, left hand: Secondary | ICD-10-CM

## 2020-11-08 DIAGNOSIS — M25562 Pain in left knee: Secondary | ICD-10-CM

## 2020-11-08 DIAGNOSIS — M19072 Primary osteoarthritis, left ankle and foot: Secondary | ICD-10-CM

## 2020-11-08 NOTE — Patient Instructions (Addendum)
Standing Labs We placed an order today for your standing lab work.   Please have your standing labs drawn in April and every 3 months  If possible, please have your labs drawn 2 weeks prior to your appointment so that the provider can discuss your results at your appointment.  We have open lab daily Monday through Thursday from 1:30-4:30 PM and Friday from 1:30-4:00 PM at the office of Dr. Bo Merino, [facility].   Please be advised, all patients with office appointments requiring lab work will take precedents over walk-in lab work.  If possible, please come for your lab work on Monday and Friday afternoons, as you may experience shorter wait times. The office is located at [address], [place], [place], [place] [address] No appointment is necessary.   Labs are drawn by [facility]. Please bring your co-pay at the time of your lab draw.  You may receive a bill from [facility] for your lab work.  If you wish to have your labs drawn at another location, please call the office 24 hours in advance to send orders.  If you have any questions regarding directions or hours of operation,  please call [phone].   As a reminder, please drink plenty of water prior to coming for your lab work. Thanks! Etanercept Injection What is this medicine? ETANERCEPT (et a [employer]) is used for the treatment of rheumatoid arthritis. The medicine is also used to treat psoriatic arthritis, ankylosing spondylitis, and psoriasis. This medicine may be used for other purposes; ask your health care provider or pharmacist if you have questions. COMMON BRAND NAME(S): Enbrel What should I tell my health care provider before I take this medicine? They need to know if you have any of these conditions:  bleeding disorder  cancer  diabetes  granulomatosis with polyangiitis  heart failure  HIV or AIDs  immune system problems  infection such as tuberculosis (TB) or other bacterial, fungal or  viral infections  liver disease  nervous system problems such as Guillain-Barre syndrome, multiple sclerosis or seizures  recent or upcoming vaccine  an unusual or allergic reaction to etanercept, other medicines, latex, rubber, food, dyes, or preservatives  pregnant or trying to get pregnant  breast-feeding How should I use this medicine? The medicine is injected under the skin. You will be taught how to prepare and give it. Take it as directed on the prescription label. Keep taking it unless your health care provider tells you stop. This medicine comes with INSTRUCTIONS FOR USE. Ask your pharmacist for directions on how to use this medicine. Read the information carefully. Talk to your pharmacist or health care provider if you have questions. If you use a pen, be sure to take off the outer needle cover before using the dose. It is important that you put your used needles and syringes in a special sharps container. Do not put them in a trash can. If you do not have a sharps container, call your pharmacist or health care provider to get one. A special MedGuide will be given to you by the pharmacist with each prescription and refill. Be sure to read this information carefully each time. Talk to your health care provider about the use of this medicine in children. While it may be prescribed for children as young as 2 years of age for selected conditions, precautions do apply. Overdosage: If you think you have taken too much of this medicine contact a poison control center or emergency room at once.  NOTE: This medicine is only for you. Do not share this medicine with others. What if I miss a dose? If you miss a dose, take it as soon as you can. If it is almost time for your next dose, take only that dose. Do not take double or extra doses. What may interact with this medicine? Do not take this medicine with any of the following medications:  biologic medicines such as adalimumab,  certolizumab, golimumab, infliximab  live vaccines  rilonacept This medicine may also interact with the following medications:  abatacept  anakinra  biologic medicines such as anifrolumab, baricitinib, belimumab, canakinumab, natalizumab, rituximab, sarilumab, tocilizumab, tofacitinib, upadacitinib, vedolizumab  cyclophosphamide  sulfasalazine This list may not describe all possible interactions. Give your health care provider a list of all the medicines, herbs, non-prescription drugs, or dietary supplements you use. Also tell them if you smoke, drink alcohol, or use illegal drugs. Some items may interact with your medicine. What should I watch for while using this medicine? Visit your health care provider for regular checks on your progress. Tell your health care provider if your symptoms do not start to get better or if they get worse. This medicine may increase your risk of getting an infection. Call your health care provider for advice if you get a fever, chills, sore throat, or other symptoms of a cold or flu. Do not treat yourself. Try to avoid being around people who are sick. If you have not had the measles or chickenpox vaccines, tell your health care provider right away if you are around someone with these viruses. You will be tested for tuberculosis (TB) before you start this medicine. If your doctor prescribes any medicine for TB, you should start taking the TB medicine before starting this medicine. Make sure to finish the full course of TB medicine. Avoid taking medicines that contain aspirin, acetaminophen, ibuprofen, naproxen, or ketoprofen unless instructed by your health care provider. These medicines may hide fever. Talk to your health care provider about your risk of cancer. You may be more at risk for certain types of cancer if you take this medicine. This medicine can decrease the response to a vaccine. If you need to get vaccinated, tell your health care provider if you  have received this medicine. Extra booster doses may be needed. Talk to your health care provider to see if a different vaccination schedule is needed. What side effects may I notice from receiving this medicine? Side effects that you should report to your health care provider as soon as possible:  allergic reactions (skin rash, itching or hives; swelling of the face, lips, or tongue)  changes in vision  dizziness  heart failure (trouble breathing; fast, irregular heartbeat; sudden weight gain; swelling of the ankles, feet, hands; unusually weak or tired)  infection (fever, chills, cough, sore throat, pain or trouble passing urine)  light-colored stools  liver injury (dark yellow or brown urine; general ill feeling or flu-like symptoms; loss of appetite, right upper belly pain; unusually weak or tired, yellowing of the eyes or skin)  low red blood cell counts (trouble breathing; feeling faint; lightheaded, falls; unusually weak or tired)  pain, tingling, numbness in the hands or feet  red, scaly patches or raised bumps on the skin  seizures  unusual bruising or bleeding  weakness in the arms or legs Side effects that usually do not require medical attention (report to your health care provider if they continue or are bothersome):  headache  nausea  pain, redness, or irritation at site where injected  vomiting This list may not describe all possible side effects. Call your doctor for medical advice about side effects. You may report side effects to FDA at 1-800-FDA-1088. Where should I keep my medicine? Keep out of the reach of children and pets. See product for storage information. Each product may have different instructions. Get rid of any unused medicine after the expiration date. To get rid of medicines that are no longer needed or have expired:  Take the medicine to a medicine take-back program. Check with your pharmacy or law enforcement to find a location.  If you  cannot return the medicine, ask your pharmacist or health care provider how to get rid of this medicine safely. NOTE: This sheet is a summary. It may not cover all possible information. If you have questions about this medicine, talk to your doctor, pharmacist, or health care provider.  2021 Elsevier/Gold Standard (2020-06-01 12:59:29)

## 2020-11-08 NOTE — Telephone Encounter (Signed)
  Follow up Call-  Call back number 11/06/2020  Post procedure Call Back phone  # [phone]  Permission to leave phone message Yes  Some recent data might be hidden    LMOM to call back with any questions or concerns.  Also, call back if patient has developed fever, respiratory issues or been dx with COVID or had any family members or close contacts diagnosed since her procedure.

## 2020-11-08 NOTE — Telephone Encounter (Signed)
NO ANSWER, MESSAGE LEFT FOR PATIENT. 

## 2020-11-12 ENCOUNTER — Other Ambulatory Visit: Payer: Self-pay

## 2020-11-12 ENCOUNTER — Ambulatory Visit: Payer: 59 | Admitting: Family

## 2020-11-12 VITALS — BP 141/78 | HR 61 | Temp 98.0°F | Resp 16 | Ht 71.0 in | Wt 213.0 lb

## 2020-11-12 DIAGNOSIS — M069 Rheumatoid arthritis, unspecified: Secondary | ICD-10-CM | POA: Diagnosis not present

## 2020-11-12 DIAGNOSIS — I10 Essential (primary) hypertension: Secondary | ICD-10-CM

## 2020-11-12 DIAGNOSIS — N400 Enlarged prostate without lower urinary tract symptoms: Secondary | ICD-10-CM

## 2020-11-12 MED ORDER — TAMSULOSIN HCL 0.4 MG PO CAPS: 0.4000 mg | ORAL_CAPSULE | Freq: Every day | ORAL | 1 refills | Status: DC

## 2020-11-12 MED ORDER — AMLODIPINE BESYLATE 5 MG PO TABS: 5.0000 mg | ORAL_TABLET | Freq: Every day | ORAL | 1 refills | Status: DC

## 2020-11-12 NOTE — Progress Notes (Signed)
Subjective:    Patient ID: William Neal, male    DOB: 06-06-1956, 65 y.o.   MRN: [id number]  HPI  Patient is a 65 yr old male who presents today for follow up.  HTN-maintained on amlodipine 5mg . Notes chronic LE edema even before the amlodipine.  BP Readings from Last 3 Encounters:  11/12/20 (!) 141/78  11/08/20 136/81  11/06/20 125/63   BPH- Feels like he does not fully empty his bladder. Gets up every 2 hrs at night. Flow is weak.   Lab Results  Component Value Date   PSA 0.45 05/14/2020   PSA 0.50 03/27/2017   PSA 0.62 03/10/2016   RA-Patient continues to follow with rheumatology and is mainatined on plaquenil 200 mg bid.    Review of Systems See HPI  Past Medical History:  Diagnosis Date  . Arthritis    RA and Osteo  . BPH (benign prostatic hyperplasia) 10/12/2012  . History of hepatitis C   . Hypertension      Social History   Socioeconomic History  . Marital status: Married    Spouse name: Not on file  . Number of children: Not on file  . Years of education: Not on file  . Highest education level: Not on file  Occupational History  . Not on file  Tobacco Use  . Smoking status: Former Smoker    Packs/day: 0.50    Years: 40.00    Pack years: 20.00    Types: Cigarettes    Quit date: 02/07/2020    Years since quitting: 0.7  . Smokeless tobacco: Never Used  Vaping Use  . Vaping Use: Former  Substance and Sexual Activity  . 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 Drug use: Never  . Sexual activity: Not on file  Other Topics Concern  . Not on file  Social History Narrative   Married to [employer] program   Married   2 step children   Enjoys fishing   Completed college   Social Determinants of Radio broadcast assistant Strain: Not on [employer] Insecurity: Not on file  Transportation Needs: Not on file  Physical Activity: Not on file  Stress: Not on file  Social Connections: Not  on file  Intimate Partner Violence: Not on file    Past Surgical History:  Procedure Laterality Date  . COLONOSCOPY    . no prior surgery    . POLYPECTOMY      Family History  Problem Relation Age of Onset  . Diabetes Mother   . Emphysema Father   . Hypertension Maternal Uncle   . Coronary artery disease Brother        multiple stents, 1/2 brother  . Heart disease Other 80       died in his sleep, ?presumed heart attack  . Hypertension Sister   . Colon cancer Neg Hx   . Esophageal cancer Neg Hx   . Rectal cancer Neg Hx   . Stomach cancer Neg Hx   . Colon polyps Neg Hx     No Known Allergies  Current Outpatient Medications on File Prior to Visit  Medication Sig Dispense Refill  . amLODipine (NORVASC) 5 MG tablet Take 1 tablet by mouth once daily 30 tablet 0  . Ascorbic Acid (VITA-C PO) Take by mouth.    . hydroxychloroquine (PLAQUENIL) 200 MG tablet Take 1 tablet (200 mg total) by mouth 2 (  two) times daily. 180 tablet 0  . meloxicam (MOBIC) 7.5 MG tablet Take 1 tablet (7.5 mg total) by mouth daily. 30 tablet 5  . Multiple Vitamin (MULTI-VITAMIN PO) Take by mouth.     Current Facility-Administered Medications on File Prior to Visit  Medication Dose Route Frequency Provider Last Rate Last Admin  . 0.9 %  sodium chloride infusion  500 mL Intravenous Continuous Milus Banister, MD        BP (!) 141/78 (BP Location: Right Arm, Patient Position: Sitting, Cuff Size: Small)   Pulse 61   Temp 98 F (36.7 C) (Oral)   Resp 16   Ht 5\' 11"  (1.803 m)   Wt 213 lb (96.6 kg)   SpO2 97%   BMI 29.71 kg/m       Objective:   Physical Exam Constitutional:      General: He is not in acute distress.    Appearance: He is well-developed.  HENT:     Head: Normocephalic and atraumatic.  Cardiovascular:     Rate and Rhythm: Normal rate and regular rhythm.     Heart sounds: No murmur heard.   Pulmonary:     Effort: Pulmonary effort is normal. No respiratory distress.      Breath sounds: Normal breath sounds. No wheezing or rales.  Musculoskeletal:     Comments: Some swelling noted beneath left knee  Skin:    General: Skin is warm and dry.  Neurological:     Mental Status: He is alert and oriented to person, place, and time.  Psychiatric:        Behavior: Behavior normal.        Thought Content: Thought content normal.           Assessment & Plan:  HTN- bp acceptable. Continue amlodipine 5mg  one daily.  BMET up to date.  BPH- uncontrolled.  Trial of flomax. He will let me know if his symptoms do not improve.  RA- has significant pain in his left knee despite recent aspiration and steroid injection.  He has declined biologic drug due to side effects.  He plans to see orthopedics for further evaluation but wishes to check in network physicians from his insurance and will schedule on his own.  This visit occurred during the SARS-CoV-2 public health emergency.  Safety protocols were in place, including screening questions prior to the visit, additional usage of staff PPE, and extensive cleaning of exam room while observing appropriate contact time as indicated for disinfecting solutions.

## 2020-11-16 ENCOUNTER — Ambulatory Visit (INDEPENDENT_AMBULATORY_CARE_PROVIDER_SITE_OTHER): Payer: 59 | Admitting: Orthopedic Surgery

## 2020-11-16 ENCOUNTER — Other Ambulatory Visit: Payer: Self-pay

## 2020-11-16 ENCOUNTER — Encounter: Payer: Self-pay | Admitting: Orthopedic Surgery

## 2020-11-16 DIAGNOSIS — M1712 Unilateral primary osteoarthritis, left knee: Secondary | ICD-10-CM

## 2020-11-16 DIAGNOSIS — M25562 Pain in left knee: Secondary | ICD-10-CM

## 2020-11-16 MED ORDER — BUPIVACAINE HCL 0.25 % IJ SOLN
4.0000 mL | INTRAMUSCULAR | Status: AC | PRN
  Administered 2020-11-16: 4 mL via INTRA_ARTICULAR

## 2020-11-16 MED ORDER — LIDOCAINE HCL 1 % IJ SOLN
5.0000 mL | INTRAMUSCULAR | Status: AC | PRN
  Administered 2020-11-16: 5 mL

## 2020-11-16 NOTE — Progress Notes (Signed)
Office Visit Note   Patient: William Neal           Date of Birth: May 31, 1956           MRN: [id number] Visit Date: 11/16/2020 Requested by: Bo Merino, MD [address] [place],   [address] PCP: Debbrah Alar, NP  Subjective: Chief Complaint  Patient presents with  . 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  He has rheumatoid arthritis.  Takes Mobic Plaquenil and used to take sulfasalazine as well.  Had an injection in January and did well until the end of February.  Now he has pretty severe anterior pain with no mechanical symptoms.  He does have to do a lot of ladder work.  He still is working but it is very difficult.  Reports swelling stiffness as well as night pain and rest pain.  The pain does wake him from sleep at night.  Outside radiographs do show moderate arthritis primarily in the medial compartment but also moderate to severe arthritis in the patellofemoral compartment.  No fevers or chills.              ROS: All systems reviewed are negative as they relate to the chief complaint within the history of present illness.  Patient denies  fevers or chills.   Assessment & Plan: Visit Diagnoses:  1. Left knee pain, unspecified chronicity     Plan: Impression is fairly debilitating left knee pain with warmth to the joint mild effusion significant arthritis is present.  He did have an injection in January.  Earliest I would want to do with knee replacement would be April.  For now for some pain relief from 1 aspirate the knee and injected with Toradol which should not delay his knee replacement if that becomes necessary.  I would also like to get an MRI scan of the left knee to evaluate for stress fracture or stress reaction which may be potentially adding to the fairly debilitating pain he has developed within the last 6 weeks.  If the scan just shows end-stage  patellofemoral and medial compartment arthritis then he will have to decide about knee replacement.  I think we can get him back working after about 3 months but going up and down ladders will be a significant challenge for him with knee replacement.  That will be something that he has to factor into his decision-making.  Follow-Up Instructions: Return for after MRI.   Orders:  Orders Placed This Encounter  Procedures  . MR Knee Left w/o contrast   No orders of the defined types were placed in this encounter.     Procedures: Large Joint Inj on 11/16/2020 3:57 PM Indications: diagnostic evaluation, joint swelling and pain Details: 18 G 1.5 in needle, superolateral approach  Arthrogram: No  Medications: 5 mL lidocaine 1 %; 4 mL bupivacaine 0.25 % Outcome: tolerated well, no immediate complications Procedure, treatment alternatives, risks and benefits explained, specific risks discussed. Consent was given by the patient. Immediately prior to procedure a time out was called to verify the correct patient, procedure, equipment, support staff and site/side marked as required. Patient was prepped and draped in the usual sterile fashion.    1 vial Toradol injected   Clinical Data: No additional findings.  Objective: Vital Signs: There were no vitals taken for this visit.  Physical Exam:   Constitutional: Patient appears  well-developed HEENT:  Head: Normocephalic Eyes:EOM are normal Neck: Normal range of motion Cardiovascular: Normal rate Pulmonary/chest: Effort normal Neurologic: Patient is alert Skin: Skin is warm Psychiatric: Patient has normal mood and affect    Ortho Exam: Ortho exam demonstrates warmth to the left knee no warmth to the right knee.  Mild effusion is present in the left knee.  No effusion right knee.  He has some general synovitis in the left knee.  Pedal pulses palpable.  Ankle dorsiflexion intact on that left-hand side.  Extensor mechanism is intact.  Has  about a 5 degree flexion contracture left knee with right knee straight.  Bends to about 100 degrees on the left and 120 on the right.  Collateral and cruciate ligaments are stable on that left-hand side.  Does have more patellofemoral crepitus on the left compared to the right  Specialty Comments:  No specialty comments available.  Imaging: No results found.   PMFS History: Patient Active Problem List   Diagnosis Date Noted  . Essential hypertension 05/14/2020  . Rheumatoid arthritis ([place]) 05/14/2020  . Other malaise and fatigue 04/24/2014  . Skin cyst 04/24/2014  . Routine general medical examination at a health care facility 10/12/2012  . History of hepatitis C 10/12/2012  . History of colon polyps 10/12/2012  . BPH (benign prostatic hyperplasia) 10/12/2012   Past Medical History:  Diagnosis Date  . Arthritis    RA and Osteo  . BPH (benign prostatic hyperplasia) 10/12/2012  . History of hepatitis C   . Hypertension     Family History  Problem Relation Age of Onset  . Diabetes Mother   . Emphysema Father   . Hypertension Maternal Uncle   . Coronary artery disease Brother        multiple stents, 1/2 brother  . Heart disease Other 80       died in his sleep, ?presumed heart attack  . Hypertension Sister   . Colon cancer Neg Hx   . Esophageal cancer Neg Hx   . Rectal cancer Neg Hx   . Stomach cancer Neg Hx   . Colon polyps Neg Hx     Past Surgical History:  Procedure Laterality Date  . COLONOSCOPY    . no prior surgery    . POLYPECTOMY     Social History   Occupational History  . Not on file  Tobacco Use  . Smoking status: Former Smoker    Packs/day: 0.50    Years: 40.00    Pack years: 20.00    Types: Cigarettes    Quit date: 02/07/2020    Years since quitting: 0.7  . Smokeless tobacco: Never Used  Vaping Use  . Vaping Use: Former  Substance and Sexual Activity  . 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 Drug use:  Never  . Sexual activity: Not on file

## 2020-11-21 ENCOUNTER — Ambulatory Visit
Admission: RE | Admit: 2020-11-21 | Discharge: 2020-11-21 | Disposition: A | Discharge status: Home / Self Care | Payer: 59 | Source: Ambulatory Visit | Attending: Orthopedic Surgery | Admitting: Orthopedic Surgery

## 2020-11-21 ENCOUNTER — Other Ambulatory Visit: Payer: Self-pay

## 2020-11-21 DIAGNOSIS — M25562 Pain in left knee: Secondary | ICD-10-CM

## 2020-11-26 ENCOUNTER — Encounter: Payer: Self-pay | Admitting: Orthopedic Surgery

## 2020-11-26 ENCOUNTER — Other Ambulatory Visit: Payer: Self-pay

## 2020-11-26 ENCOUNTER — Ambulatory Visit (INDEPENDENT_AMBULATORY_CARE_PROVIDER_SITE_OTHER): Payer: 59 | Admitting: Orthopedic Surgery

## 2020-11-26 ENCOUNTER — Ambulatory Visit: Payer: 59 | Admitting: Orthopedic Surgery

## 2020-11-26 DIAGNOSIS — M1712 Unilateral primary osteoarthritis, left knee: Secondary | ICD-10-CM

## 2020-11-26 DIAGNOSIS — M25562 Pain in left knee: Secondary | ICD-10-CM

## 2020-11-26 NOTE — Progress Notes (Signed)
Office Visit Note   Patient: William Neal           Date of Birth: 1956-04-18           MRN: [id number] Visit Date: 11/26/2020 Requested by: Debbrah Alar, NP [address] [place],  [place] [address] PCP: Debbrah Alar, NP  Subjective: Chief Complaint  Patient presents with  . Left Knee - Pain    HPI: Delmo Matty is a 65 y.o. male who presents to the office complaining of left knee pain.  He is here for vitamin D lab but has concerned over posterior left knee swelling.  Denies any significant posterior knee pain, localizes most of his pain to the anterior left knee.  However, he has noticed some fullness with flexion and extension in the posterior left knee.  He has obtained crutches with intention to start nonweightbearing as was discussed with Dr. Marlou Sa on the phone when reviewing his MRI scan last week..                ROS: All systems reviewed are negative as they relate to the chief complaint within the history of present illness.  Patient denies fevers or chills.  Assessment & Plan: Visit Diagnoses:  1. Left knee pain, unspecified chronicity   2. Arthritis of left knee     Plan: Patient is a 65 year old male who presents complaining of left knee posterior swelling.  He has come into the office today for vitamin D check following review of MRI scan last week over the phone with Dr. Marlou Sa.  MRI revealed nondisplaced, nondepressed fracture of the anterior aspect of the medial tibial plateau not involving the articular surface with full-thickness cartilage loss of the medial compartment and subchondral reactive marrow changes.  He understands that there is a chance that his symptoms improve with a period of nonweightbearing but there is potential for him to have recurrence of his pain and have need to undergo surgery.  He understands and he will attempt nonweightbearing with crutches for the next 3 weeks.  He will call the office if there is any difficulties.   He understands that aspiration of Baker's cyst could likely result in recurrence and it is not causing him significant difficulty at this time to consider aspiration.  He is currently on short-term disability.  Follow-up in 3 weeks for clinical recheck.  Follow-Up Instructions: No follow-ups on file.   Orders:  Orders Placed This Encounter  Procedures  . Vitamin D (25 hydroxy)   No orders of the defined types were placed in this encounter.     Procedures: No procedures performed   Clinical Data: No additional findings.  Objective: Vital Signs: There were no vitals taken for this visit.  Physical Exam:  Constitutional: Patient appears well-developed HEENT:  Head: Normocephalic Eyes:EOM are normal Neck: Normal range of motion Cardiovascular: Normal rate Pulmonary/chest: Effort normal Neurologic: Patient is alert Skin: Skin is warm Psychiatric: Patient has normal mood and affect  Ortho Exam: Ortho exam demonstrates left knee with trace effusion.  Small Baker's cyst of the left knee with some mildly increased fullness compared with the contralateral knee.  No tenderness to palpation throughout the posterior knee.  Specialty Comments:  No specialty comments available.  Imaging: No results found.   PMFS History: Patient Active Problem List   Diagnosis Date Noted  . Essential hypertension 05/14/2020  . Rheumatoid arthritis ([place]) 05/14/2020  . Other malaise and fatigue 04/24/2014  . Skin cyst 04/24/2014  .  Routine general medical examination at a health care facility 10/12/2012  . History of hepatitis C 10/12/2012  . History of colon polyps 10/12/2012  . BPH (benign prostatic hyperplasia) 10/12/2012   Past Medical History:  Diagnosis Date  . Arthritis    RA and Osteo  . BPH (benign prostatic hyperplasia) 10/12/2012  . History of hepatitis C   . Hypertension     Family History  Problem Relation Age of Onset  . Diabetes Mother   . Emphysema Father   .  Hypertension Maternal Uncle   . Coronary artery disease Brother        multiple stents, 1/2 brother  . Heart disease Other 80       died in his sleep, ?presumed heart attack  . Hypertension Sister   . Colon cancer Neg Hx   . Esophageal cancer Neg Hx   . Rectal cancer Neg Hx   . Stomach cancer Neg Hx   . Colon polyps Neg Hx     Past Surgical History:  Procedure Laterality Date  . COLONOSCOPY    . no prior surgery    . POLYPECTOMY     Social History   Occupational History  . Not on file  Tobacco Use  . Smoking status: Former Smoker    Packs/day: 0.50    Years: 40.00    Pack years: 20.00    Types: Cigarettes    Quit date: 02/07/2020    Years since quitting: 0.8  . Smokeless tobacco: Never Used  Vaping Use  . Vaping Use: Former  Substance and Sexual Activity  . 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 Drug use: Never  . Sexual activity: Not on file

## 2020-11-27 ENCOUNTER — Other Ambulatory Visit: Payer: Self-pay | Admitting: Radiology

## 2020-11-27 ENCOUNTER — Telehealth: Payer: Self-pay | Admitting: Orthopedic Surgery

## 2020-11-27 LAB — EXTRA LAV TOP TUBE

## 2020-11-27 LAB — VITAMIN D 25 HYDROXY (VIT D DEFICIENCY, FRACTURES): Vit D, 25-Hydroxy: 30 ng/mL (ref 30–100)

## 2020-11-27 MED ORDER — VITAMIN D (ERGOCALCIFEROL) 1.25 MG (50000 UNIT) PO CAPS: ORAL_CAPSULE | ORAL | 0 refills | Status: DC

## 2020-11-27 NOTE — Telephone Encounter (Signed)
Pt called wanting to make William Neal aware he will be a faxing a form for his benefits claim that he needs filled out and would like a CB when it's been received.   [phone]

## 2020-11-27 NOTE — Telephone Encounter (Signed)
Holding for Lauren. ?

## 2020-11-27 NOTE — Telephone Encounter (Signed)
Holding.

## 2020-11-27 NOTE — Progress Notes (Signed)
Can you send in 50,000 units a week for the next 4 weeks.  His vitamin D is borderline to low.

## 2020-11-27 NOTE — Telephone Encounter (Signed)
Form received. Ciox?

## 2020-11-28 NOTE — Telephone Encounter (Signed)
Received will review and complete.  Patient advised.

## 2020-12-17 ENCOUNTER — Ambulatory Visit (INDEPENDENT_AMBULATORY_CARE_PROVIDER_SITE_OTHER): Payer: 59

## 2020-12-17 ENCOUNTER — Ambulatory Visit (INDEPENDENT_AMBULATORY_CARE_PROVIDER_SITE_OTHER): Payer: 59 | Admitting: Orthopedic Surgery

## 2020-12-17 DIAGNOSIS — M25562 Pain in left knee: Secondary | ICD-10-CM | POA: Diagnosis not present

## 2020-12-23 ENCOUNTER — Encounter: Payer: Self-pay | Admitting: Orthopedic Surgery

## 2020-12-23 NOTE — Progress Notes (Signed)
Office Visit Note   Patient: William Neal           Date of Birth: [birth year]           MRN: [id number] Visit Date: 12/17/2020 Requested by: Debbrah Alar, NP [address] [place],  [place] [address] PCP: Debbrah Alar, NP  Subjective: Chief Complaint  Patient presents with  . Left Knee - Follow-up    HPI: William Neal is a patient here to follow-up medial tibial plateau stress reaction.  Still having pain after activity but its not severe and debilitating.  He has been off of the knee for about 2 and half weeks.  Try to start walking around some.  Supposed to return to work next Monday.  I really think the best thing would be not trying to test this plateau fracture recovery too soon.              ROS: All systems reviewed are negative as they relate to the chief complaint within the history of present illness.  Patient denies  fevers or chills.   Assessment & Plan: Visit Diagnoses:  1. Left knee pain, unspecified chronicity     Plan: Impression is left knee tibial plateau stress reaction/fracture.  He needs to be out of work until May 11.  Overall the acute debilitating pain has passed but I think he got out over his skis a little bit in terms of trying to see what he could or could not do in terms of standing and walking endurance.  Continue with nonloadbearing or minimal loadbearing exercises until 4 weeks.  We will keep him out of work until May 11 which I think should be enough time for this to heal.  Obviously with the arthritis present in the knee this will not be complete pain relief but the debilitating type of pain should be improved.  Follow-Up Instructions: Return in about 4 weeks (around 01/14/2021).   Orders:  Orders Placed This Encounter  Procedures  . XR Knee 1-2 Views Left   No orders of the defined types were placed in this encounter.     Procedures: No procedures performed   Clinical Data: No additional findings.  Objective: Vital  Signs: There were no vitals taken for this visit.  Physical Exam:   Constitutional: Patient appears well-developed HEENT:  Head: Normocephalic Eyes:EOM are normal Neck: Normal range of motion Cardiovascular: Normal rate Pulmonary/chest: Effort normal Neurologic: Patient is alert Skin: Skin is warm Psychiatric: Patient has normal mood and affect    Ortho Exam: Ortho exam demonstrates full active and passive range of motion of the left knee.  Fairly minimal medial joint line tenderness is present and no real tenderness over the medial tibial plateau.  Collateral crucial ligaments are stable.  No other masses lymphadenopathy or skin changes noted in that left knee region.  Specialty Comments:  No specialty comments available.  Imaging: No results found.   PMFS History: Patient Active Problem List   Diagnosis Date Noted  . Essential hypertension 05/14/2020  . Rheumatoid arthritis ([place]) 05/14/2020  . Other malaise and fatigue 04/24/2014  . Skin cyst 04/24/2014  . Routine general medical examination at a health care facility 10/12/2012  . History of hepatitis C 10/12/2012  . History of colon polyps 10/12/2012  . BPH (benign prostatic hyperplasia) 10/12/2012   Past Medical History:  Diagnosis Date  . Arthritis    RA and Osteo  . BPH (benign prostatic hyperplasia) 10/12/2012  . History of  hepatitis C   . Hypertension     Family History  Problem Relation Age of Onset  . Diabetes Mother   . Emphysema Father   . Hypertension Maternal Uncle   . Coronary artery disease Brother        multiple stents, 1/2 brother  . Heart disease Other 80       died in his sleep, ?presumed heart attack  . Hypertension Sister   . Colon cancer Neg Hx   . Esophageal cancer Neg Hx   . Rectal cancer Neg Hx   . Stomach cancer Neg Hx   . Colon polyps Neg Hx     Past Surgical History:  Procedure Laterality Date  . COLONOSCOPY    . no prior surgery    . POLYPECTOMY     Social History    Occupational History  . Not on file  Tobacco Use  . Smoking status: Former Smoker    Packs/day: 0.50    Years: 40.00    Pack years: 20.00    Types: Cigarettes    Quit date: 02/07/2020    Years since quitting: 0.8  . Smokeless tobacco: Never Used  Vaping Use  . Vaping Use: Former  Substance and Sexual Activity  . 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 Drug use: Never  . Sexual activity: Not on file

## 2021-01-14 ENCOUNTER — Ambulatory Visit (INDEPENDENT_AMBULATORY_CARE_PROVIDER_SITE_OTHER): Payer: 59 | Admitting: Orthopedic Surgery

## 2021-01-14 DIAGNOSIS — M1712 Unilateral primary osteoarthritis, left knee: Secondary | ICD-10-CM | POA: Diagnosis not present

## 2021-01-16 ENCOUNTER — Encounter: Payer: Self-pay | Admitting: Orthopedic Surgery

## 2021-01-16 NOTE — Progress Notes (Signed)
Office Visit Note   Patient: William Neal           Date of Birth: 26-Jan-1956           MRN: [id number] Visit Date: 01/14/2021 Requested by: Debbrah Alar, NP [address] [place],  [place] [address] PCP: Debbrah Alar, NP  Subjective: Chief Complaint  Patient presents with  . Left Knee - Follow-up    HPI: William Neal is a 65 y.o. male who presents to the office complaining of continued left knee pain.  Patient is following up for medial tibial plateau stress fracture.  He returns after returning to work following his period of nonweightbearing.  He reports he is doing some better though he does have continued pain.  He feels he is back to his "baseline" level of pain that he was experiencing prior to the stress fracture.  He is able to navigate stairs and ladders with some baseline level of pain but is not severe and incapacitating.  He does note occasional swelling and some morning stiffness.  Pain does not wake him up at night.  He takes Advil multiple times throughout the day.  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                ROS: All systems reviewed are negative as they relate to the chief complaint within the history of present illness.  Patient denies fevers or chills.  Assessment & Plan: Visit Diagnoses:  1. Arthritis of left knee     Plan: Patient is a 65 year old male who presents complaining of continued left knee pain.  Patient's pain has returned back to baseline level of pain from knee osteoarthritis following his period of nonweightbearing for left knee tibial plateau stress reaction.  He has been able to return to work successfully which involves navigating stairs and ladders.  No longer waking with pain.  Patient reports he would like to postpone total knee replacement though he knows he will likely need one in the future.  His plan is to have total knee arthroplasty somewhere between 83  and 79 years of age.  Wants to retire at 26.  He requested 18-month follow-up appointment to check-in and potentially consider cortisone injection at that time.  He may return sooner if he has increased pain in the knee.  Patient agreed with plan.  Follow-Up Instructions: No follow-ups on file.   Orders:  No orders of the defined types were placed in this encounter.  No orders of the defined types were placed in this encounter.     Procedures: No procedures performed   Clinical Data: No additional findings.  Objective: Vital Signs: There were no vitals taken for this visit.  Physical Exam:  Constitutional: Patient appears well-developed HEENT:  Head: Normocephalic Eyes:EOM are normal Neck: Normal range of motion Cardiovascular: Normal rate Pulmonary/chest: Effort normal Neurologic: Patient is alert Skin: Skin is warm Psychiatric: Patient has normal mood and affect  Ortho Exam: Ortho exam demonstrates left knee with small effusion.  3 degrees of extension, 120 degrees of knee flexion.  Tenderness over the medial and lateral joint lines, more so over the medial.  No calf tenderness.  Negative Homans' sign.  No pain with hip range of motion.  Able to form straight leg raise without extensor lag or difficulty.  Specialty Comments:  No specialty comments available.  Imaging: No results found.   PMFS History: Patient Active  Problem List   Diagnosis Date Noted  . Essential hypertension 05/14/2020  . Rheumatoid arthritis ([place]) 05/14/2020  . Other malaise and fatigue 04/24/2014  . Skin cyst 04/24/2014  . Routine general medical examination at a health care facility 10/12/2012  . History of hepatitis C 10/12/2012  . History of colon polyps 10/12/2012  . BPH (benign prostatic hyperplasia) 10/12/2012   Past Medical History:  Diagnosis Date  . Arthritis    RA and Osteo  . BPH (benign prostatic hyperplasia) 10/12/2012  . History of hepatitis C   . Hypertension     Family  History  Problem Relation Age of Onset  . Diabetes Mother   . Emphysema Father   . Hypertension Maternal Uncle   . Coronary artery disease Brother        multiple stents, 1/2 brother  . Heart disease Other 80       died in his sleep, ?presumed heart attack  . Hypertension Sister   . Colon cancer Neg Hx   . Esophageal cancer Neg Hx   . Rectal cancer Neg Hx   . Stomach cancer Neg Hx   . Colon polyps Neg Hx     Past Surgical History:  Procedure Laterality Date  . COLONOSCOPY    . no prior surgery    . POLYPECTOMY     Social History   Occupational History  . Not on file  Tobacco Use  . Smoking status: Former Smoker    Packs/day: 0.50    Years: 40.00    Pack years: 20.00    Types: Cigarettes    Quit date: 02/07/2020    Years since quitting: 0.9  . Smokeless tobacco: Never Used  Vaping Use  . Vaping Use: Former  Substance and Sexual Activity  . 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 Drug use: Never  . Sexual activity: Not on file

## 2021-01-24 NOTE — Progress Notes (Deleted)
Office Visit Note  Patient: Lawrnce Neal             Date of Birth: 1956-06-09           MRN: [id number]             PCP: Debbrah Alar, NP Referring: Debbrah Alar, NP Visit Date: 02/07/2021 Occupation: @GUAROCC @  Subjective:  No chief complaint on file.   History of Present Illness: William Neal is a 65 y.o. male ***   Activities of Daily Living:  Patient reports morning stiffness for *** {minute/hour:19697}.   Patient {ACTIONS;DENIES/REPORTS:21021675::"Denies"} nocturnal pain.  Difficulty dressing/grooming: {ACTIONS;DENIES/REPORTS:21021675::"Denies"} Difficulty climbing stairs: {ACTIONS;DENIES/REPORTS:21021675::"Denies"} Difficulty getting out of chair: {ACTIONS;DENIES/REPORTS:21021675::"Denies"} Difficulty using hands for taps, buttons, cutlery, and/or writing: {ACTIONS;DENIES/REPORTS:21021675::"Denies"}  No Rheumatology ROS completed.   PMFS History:  Patient Active Problem List   Diagnosis Date Noted  . Essential hypertension 05/14/2020  . Rheumatoid arthritis ([place]) 05/14/2020  . Other malaise and fatigue 04/24/2014  . Skin cyst 04/24/2014  . Routine general medical examination at a health care facility 10/12/2012  . History of hepatitis C 10/12/2012  . History of colon polyps 10/12/2012  . BPH (benign prostatic hyperplasia) 10/12/2012    Past Medical History:  Diagnosis Date  . Arthritis    RA and Osteo  . BPH (benign prostatic hyperplasia) 10/12/2012  . History of hepatitis C   . Hypertension     Family History  Problem Relation Age of Onset  . Diabetes Mother   . Emphysema Father   . Hypertension Maternal Uncle   . Coronary artery disease Brother        multiple stents, 1/2 brother  . Heart disease Other 80       died in his sleep, ?presumed heart attack  . Hypertension Sister   . Colon cancer Neg Hx   . Esophageal cancer Neg Hx   . Rectal cancer Neg Hx   . Stomach cancer Neg Hx   . Colon polyps Neg Hx    Past Surgical History:   Procedure Laterality Date  . COLONOSCOPY    . no prior surgery    . 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 Influenza, Quadrivalent, Recombinant, Inj, Pf 05/22/2019  . Influenza,inj,Quad PF,6+ Mos 05/29/2017  . Influenza-Unspecified 06/01/2020  . Td 09/01/1998  . Tdap 10/12/2012     Objective: Vital Signs: There were no vitals taken for this visit.   Physical Exam   Musculoskeletal Exam: ***  CDAI Exam: CDAI Score: -- Patient Global: --; Provider Global: -- Swollen: --; Tender: -- Joint Exam 02/07/2021   No joint exam has been documented for this visit   There is currently no information documented on the homunculus. Go to the Rheumatology activity and complete the homunculus joint exam.  Investigation: No additional findings.  Imaging: No results found.  Recent Labs: Lab Results  Component Value Date   WBC 7.3 09/04/2020   HGB 14.8 09/04/2020   PLT 213 09/04/2020   NA 142 09/04/2020   K 4.5 09/04/2020   CL 106 09/04/2020   CO2 27 09/04/2020   GLUCOSE 80 09/04/2020   BUN 12 09/04/2020   CREATININE 1.00 09/04/2020   BILITOT 0.4 09/04/2020   ALKPHOS 84 03/27/2017   AST 20 09/04/2020  ALT 16 09/04/2020   PROT 7.0 09/04/2020   ALBUMIN 4.4 03/27/2017   CALCIUM 10.0 09/04/2020   GFRAA 93 09/04/2020    Speciality Comments: PLQ Eye Exam: 08/17/2020 WNL @ [facility] follow up in 6 months.   Procedures:  No procedures performed Allergies: Patient has no known allergies.   Assessment / Plan:     Visit Diagnoses: Rheumatoid arthritis with rheumatoid factor of multiple sites without organ or systems involvement (HCC)  High risk medication use  Primary osteoarthritis of both hands  Effusion, left knee  Primary osteoarthritis of left  knee  Primary osteoarthritis of both feet  History of colon polyps  History of hepatitis C  COVID-19 virus infection  Orders: No orders of the defined types were placed in this encounter.  No orders of the defined types were placed in this encounter.   Face-to-face time spent with patient was *** minutes. Greater than 50% of time was spent in counseling and coordination of care.  Follow-Up Instructions: No follow-ups on file.   Ofilia Neas, PA-C  Note - This record has been created using Dragon software.  Chart creation errors have been sought, but may not always  have been located. Such creation errors do not reflect on  the standard of medical care.

## 2021-02-07 ENCOUNTER — Ambulatory Visit: Payer: 59 | Admitting: Physician Assistant

## 2021-02-07 DIAGNOSIS — M19041 Primary osteoarthritis, right hand: Secondary | ICD-10-CM

## 2021-02-07 DIAGNOSIS — M25462 Effusion, left knee: Secondary | ICD-10-CM

## 2021-02-07 DIAGNOSIS — Z8619 Personal history of other infectious and parasitic diseases: Secondary | ICD-10-CM

## 2021-02-07 DIAGNOSIS — Z79899 Other long term (current) drug therapy: Secondary | ICD-10-CM

## 2021-02-07 DIAGNOSIS — U071 COVID-19: Secondary | ICD-10-CM

## 2021-02-07 DIAGNOSIS — M19071 Primary osteoarthritis, right ankle and foot: Secondary | ICD-10-CM

## 2021-02-07 DIAGNOSIS — M1712 Unilateral primary osteoarthritis, left knee: Secondary | ICD-10-CM

## 2021-02-07 DIAGNOSIS — M0579 Rheumatoid arthritis with rheumatoid factor of multiple sites without organ or systems involvement: Secondary | ICD-10-CM

## 2021-02-07 DIAGNOSIS — Z86010 Personal history of colonic polyps: Secondary | ICD-10-CM

## 2021-02-28 ENCOUNTER — Other Ambulatory Visit: Payer: Self-pay | Admitting: Family

## 2021-04-01 ENCOUNTER — Other Ambulatory Visit: Payer: Self-pay | Admitting: Family

## 2021-04-04 MED ORDER — MELOXICAM 7.5 MG PO TABS: 7.5000 mg | ORAL_TABLET | Freq: Every day | ORAL | 0 refills | Status: DC

## 2021-05-07 ENCOUNTER — Other Ambulatory Visit: Payer: Self-pay | Admitting: Family

## 2021-05-15 ENCOUNTER — Ambulatory Visit (INDEPENDENT_AMBULATORY_CARE_PROVIDER_SITE_OTHER): Payer: 59 | Admitting: Family

## 2021-05-15 ENCOUNTER — Other Ambulatory Visit: Payer: Self-pay

## 2021-05-15 ENCOUNTER — Encounter: Payer: Self-pay | Admitting: Family

## 2021-05-15 VITALS — BP 122/72 | HR 57 | Temp 97.9°F | Resp 19 | Ht 71.0 in | Wt 213.0 lb

## 2021-05-15 DIAGNOSIS — Z0000 Encounter for general adult medical examination without abnormal findings: Secondary | ICD-10-CM | POA: Diagnosis not present

## 2021-05-15 DIAGNOSIS — I10 Essential (primary) hypertension: Secondary | ICD-10-CM | POA: Diagnosis not present

## 2021-05-15 DIAGNOSIS — G629 Polyneuropathy, unspecified: Secondary | ICD-10-CM

## 2021-05-15 DIAGNOSIS — N400 Enlarged prostate without lower urinary tract symptoms: Secondary | ICD-10-CM

## 2021-05-15 DIAGNOSIS — E781 Pure hyperglyceridemia: Secondary | ICD-10-CM | POA: Diagnosis not present

## 2021-05-15 DIAGNOSIS — R739 Hyperglycemia, unspecified: Secondary | ICD-10-CM

## 2021-05-15 DIAGNOSIS — M069 Rheumatoid arthritis, unspecified: Secondary | ICD-10-CM | POA: Diagnosis not present

## 2021-05-15 LAB — LIPID PANEL
Cholesterol: 179 mg/dL (ref 0–200)
HDL: 30.9 mg/dL — ABNORMAL LOW (ref >39.00)
NonHDL: 147.9
Total CHOL/HDL Ratio: 6
Triglycerides: 227 mg/dL — ABNORMAL HIGH (ref 0.0–149.0)
VLDL: 45.4 mg/dL — ABNORMAL HIGH (ref 0.0–40.0)

## 2021-05-15 LAB — CBC WITH DIFFERENTIAL/PLATELET
Basophils Absolute: 0.1 10*3/uL (ref 0.0–0.1)
Basophils Relative: 0.7 % (ref 0.0–3.0)
Eosinophils Absolute: 0.2 10*3/uL (ref 0.0–0.7)
Eosinophils Relative: 2.2 % (ref 0.0–5.0)
HCT: 44.9 % (ref 39.0–52.0)
Hemoglobin: 15.2 g/dL (ref 13.0–17.0)
Lymphocytes Relative: 19.7 % (ref 12.0–46.0)
Lymphs Abs: 1.5 10*3/uL (ref 0.7–4.0)
MCHC: 33.8 g/dL (ref 30.0–36.0)
MCV: 96 fl (ref 78.0–100.0)
Monocytes Absolute: 1 10*3/uL (ref 0.1–1.0)
Monocytes Relative: 13.2 % — ABNORMAL HIGH (ref 3.0–12.0)
Neutro Abs: 4.8 10*3/uL (ref 1.4–7.7)
Neutrophils Relative %: 64.2 % (ref 43.0–77.0)
Platelets: 189 10*3/uL (ref 150.0–400.0)
RBC: 4.68 Mil/uL (ref 4.22–5.81)
RDW: 13.3 % (ref 11.5–15.5)
WBC: 7.5 10*3/uL (ref 4.0–10.5)

## 2021-05-15 LAB — COMPREHENSIVE METABOLIC PANEL
ALT: 25 U/L (ref 0–53)
AST: 23 U/L (ref 0–37)
Albumin: 4.4 g/dL (ref 3.5–5.2)
Alkaline Phosphatase: 82 U/L (ref 39–117)
BUN: 14 mg/dL (ref 6–23)
CO2: 25 mEq/L (ref 19–32)
Calcium: 9.6 mg/dL (ref 8.4–10.5)
Chloride: 105 mEq/L (ref 96–112)
Creatinine, Ser: 0.89 mg/dL (ref 0.40–1.50)
GFR: 91.66 mL/min (ref >60.00)
Glucose, Bld: 100 mg/dL — ABNORMAL HIGH (ref 70–99)
Potassium: 4 mEq/L (ref 3.5–5.1)
Sodium: 139 mEq/L (ref 135–145)
Total Bilirubin: 0.6 mg/dL (ref 0.2–1.2)
Total Protein: 6.7 g/dL (ref 6.0–8.3)

## 2021-05-15 LAB — HEMOGLOBIN A1C: Hgb A1c MFr Bld: 5.8 % (ref 4.6–6.5)

## 2021-05-15 LAB — LDL CHOLESTEROL, DIRECT: Direct LDL: 117 mg/dL

## 2021-05-15 MED ORDER — TAMSULOSIN HCL 0.4 MG PO CAPS: 0.4000 mg | ORAL_CAPSULE | Freq: Every day | ORAL | 1 refills | Status: DC

## 2021-05-15 MED ORDER — AMLODIPINE BESYLATE 5 MG PO TABS: 5.0000 mg | ORAL_TABLET | Freq: Every day | ORAL | 1 refills | Status: DC

## 2021-05-15 NOTE — Assessment & Plan Note (Signed)
Discussed healthy diet, exercise and weight loss.  Declines flu shot and covid vaccine.Tetanus up to date. Colo due next year.  Recommended that he schedule routine dental visit.

## 2021-05-15 NOTE — Assessment & Plan Note (Signed)
Stable. Continue flomax.

## 2021-05-15 NOTE — Progress Notes (Addendum)
Subjective:   By signing my name below, I, Shehryar Baig, attest that this documentation has been prepared under the direction and in the presence of Debbrah Alar NP. 05/15/2021    Patient ID: William Neal, male    DOB: 02/21/1956, 65 y.o.   MRN: [id number]  Chief Complaint  Patient presents with   Annual Exam    HPI Patient is in today for a comprehensive physical exam.  Joint pain-He complains of pain in the base of his right thumb. His pain worsens at the end of the day after work.  He continues having joint pain. He has a history of rheumatoid arthritis. He notes that while getting an injection and releasing fluid in his right knee he had developed a tibial fracture. Since then he has stopped regularly seeing his rheumatologist.  Foot pain- He complains of burning foot pain. He describes the sensation has more burning than painful. He denies having any low back issues at this time. He notes that he is pre-diabetic and thinks it might be neuropathy from diabetes. The pain does not bother him at night and it does not affect his sleep.  Cholesterol- His cholesterol levels were slightly elevated during his last lipid panel. He is interested checking his cholesterol during his next lab work.   Lab Results  Component Value Date   CHOL 182 05/14/2020   HDL 36 (L) 05/14/2020   LDLCALC 122 (H) 05/14/2020   TRIG 127 05/14/2020   CHOLHDL 5.1 (H) 05/14/2020   He denies having any unexpected weight change, ear pain, hearing loss and rhinorrhea, visual disturbance, cough, chest pain and leg swelling, nausea, vomiting, diarrhea and blood in stool, or dysuria and frequency, for myalgias and arthralgias, rash, headaches, adenopathy, depression or anxiety at this time. He has no recent changes to his family medical history. He has no recent surgical procedures. He does not drink alcohol. He does not use drugs. He does not use tobacco products.   Immunizations: He is UTD on tetanus vaccines.  He is not interested in getting the flu vaccine during this visit. He does not have the Covid-19 vaccines and is not interested in getting it.  Diet: He is maintaining a well balanced diet. He tries to avoid fast food and junk food.  Exercise: He stopped participating in regular exercise the past couple of weeks due to knee pain. He gets regular exercise at work.  Colonoscopy: Last completed 11/06/2020. Results showed three 6-9 mm polyps in the transverse colon and the ascending colon which was removed, one 30 mm polyp at the splenic flexure which was removed, 3 other small adenomatous appearing polyp in the left colon which were not removed due to length of procedure, internal hemorrhoids, otherwise results are normal.  Vision: He is due for vision care.  Dental:  He is due for dental care and is not interested in making an appointment due to the stress and anxiety during visit procedures.    Health Maintenance Due  Topic Date Due   Pneumococcal Vaccine 73-45 Years old (1 - PCV) Never done   HIV Screening  Never done   Zoster Vaccines- Shingrix (1 of 2) Never done    Past Medical History:  Diagnosis Date   Arthritis    RA and Osteo   BPH (benign prostatic hyperplasia) 10/12/2012   History of hepatitis C    Hypertension     Past Surgical History:  Procedure Laterality Date   COLONOSCOPY     no prior surgery  POLYPECTOMY      Family History  Problem Relation Age of Onset   Diabetes Mother    Emphysema Father    Hypertension Maternal Uncle    Coronary artery disease Brother        multiple stents, 1/2 brother   Heart disease Other 11       died in his sleep, ?presumed heart attack   Hypertension Sister    Colon cancer Neg Hx    Esophageal cancer Neg Hx    Rectal cancer Neg Hx    Stomach cancer Neg Hx    Colon polyps Neg Hx     Social History   Socioeconomic History   Marital status: Married    Spouse name: Not on file   Number of children: Not on file   Years of  education: Not on file   Highest education level: Not on file  Occupational History   Not on file  Tobacco Use   Smoking status: Former    Packs/day: 0.50    Years: 40.00    Pack years: 20.00    Types: Cigarettes    Quit date: 02/07/2020    Years since quitting: 1.2   Smokeless tobacco: Never  Vaping Use   Vaping Use: Former  Substance and Sexual Activity   Alcohol use: Not Currently    Comment: former heavy alcohol user, 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  Other Topics Concern   Not on file  Social History Narrative   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     amLODipine (NORVASC) 5 MG tablet Take 1 tablet (5 mg total) by mouth daily. 90 tablet 1   hydroxychloroquine (PLAQUENIL) 200 MG tablet Take 1 tablet (200 mg total) by mouth 2 (two) times daily. 180 tablet 0   tamsulosin (FLOMAX) 0.4 MG CAPS capsule Take 1 capsule (0.4 mg total) by mouth daily. 90 capsule 1   Ascorbic Acid (VITA-C PO) Take by mouth.     Vitamin D, Ergocalciferol, (DRISDOL) 1.25 MG (50000 UNIT) CAPS capsule Take 50,000 units(1 capsule) per week x 4 weeks. 4 capsule 0   Facility-Administered Medications Prior to Visit  Medication Dose Route Frequency Provider Last Rate Last Admin   0.9 %  sodium chloride infusion  500 mL Intravenous Continuous Milus Banister, MD        No Known Allergies  Review of Systems  Constitutional:         (-)unexpected weight change (-)Adenopathy  HENT:         (-)Rhinorrhea   Eyes:        (-)Visual disturbance  Respiratory:  Negative for cough.   Cardiovascular:  Negative for leg swelling.  Gastrointestinal:  Negative for blood in stool, constipation, diarrhea, nausea and vomiting.  Genitourinary:  Negative for dysuria and frequency.  Musculoskeletal:  Positive  for joint pain (Right knee and base of thumb). Negative for myalgias.  Skin:  Negative for rash.  Neurological:  Negative for headaches.  Psychiatric/Behavioral:  Negative for depression. The patient is not nervous/anxious.       Objective:    Physical Exam Constitutional:      General: He is not in acute distress.    Appearance: Normal appearance. He is not ill-appearing.  HENT:     Head: Normocephalic and atraumatic.     Right Ear: Tympanic membrane, ear canal and external ear normal.     Left Ear: Tympanic membrane, ear canal and external ear normal.  Eyes:     Extraocular Movements: Extraocular movements intact.     Pupils: Pupils are equal, round, and reactive to light.     Comments: No nystagmus  Cardiovascular:     Rate and Rhythm: Normal rate and regular rhythm.     Heart sounds: Normal heart sounds. No murmur heard.   No gallop.  Pulmonary:     Effort: Pulmonary effort is normal. No respiratory distress.     Breath sounds: Normal breath sounds. No wheezing or rales.  Abdominal:     General: Bowel sounds are normal. There is no distension.     Palpations: Abdomen is soft.     Tenderness: There is no abdominal tenderness. There is no guarding.  Musculoskeletal:     Comments: 5/5 strength in both upper and lower extremities  Skin:    General: Skin is warm and dry.  Neurological:     Mental Status: He is alert and oriented to person, place, and time.     Deep Tendon Reflexes:     Reflex Scores:      Patellar reflexes are 2+ on the right side.    Comments: Could not complete DTR exam on left knee due to  pain  Psychiatric:        Behavior: Behavior normal.    BP 122/72 (BP Location: Left Arm, Patient Position: Sitting, Cuff Size: Normal)   Pulse (!) 57   Temp 97.9 F (36.6 C)   Resp 19   Ht 5' 11"  (1.803 m)   Wt 213 lb (96.6 kg)   SpO2 95%   BMI 29.71 kg/m  Wt Readings from Last 3 Encounters:  05/15/21 213 lb (96.6 kg)  11/12/20 213 lb (96.6 kg)  11/08/20 210 lb 6.4 oz (95.4 kg)       Assessment & Plan:   Problem List Items Addressed This Visit       Unprioritized   Rheumatoid arthritis ([place])    Declines further rheumatology work up at this time. Continues follow up with orthopedics.        Relevant Orders   CBC with Differential/Platelet   Preventative health care - Primary    Discussed healthy diet, exercise and weight loss.  Declines flu shot and covid vaccine.Tetanus up to date. Colo due next year.  Recommended that he schedule routine dental visit.       Relevant Orders   CBC with Differential/Platelet   Essential hypertension    BP stable. Continue amlodipine 62m once daily.       Relevant Medications   amLODipine (NORVASC) 5 MG tablet   Other Relevant Orders   Comp Met (CMET)   BPH (benign prostatic hyperplasia)    Stable. Continue flomax.       Relevant Medications   tamsulosin (FLOMAX) 0.4 MG CAPS capsule   Other Visit Diagnoses     Hyperglycemia  Relevant Orders   Comp Met (CMET)   Hemoglobin A1c   Neuropathy       Hypertriglyceridemia       Relevant Medications   amLODipine (NORVASC) 5 MG tablet   Other Relevant Orders   Lipid panel        Meds ordered this encounter  Medications   amLODipine (NORVASC) 5 MG tablet    Sig: Take 1 tablet (5 mg total) by mouth daily.    Dispense:  90 tablet    Refill:  1    Order Specific Question:   Supervising Provider    Answer:   Penni Homans A [[id number]]   tamsulosin (FLOMAX) 0.4 MG CAPS capsule    Sig: Take 1 capsule (0.4 mg total) by mouth daily.    Dispense:  90 capsule    Refill:   1    Order Specific Question:   Supervising Provider    Answer:   Penni Homans A [[id number]]    I, Debbrah Alar NP, personally preformed the services described in this documentation.  All medical record entries made by the scribe were at my direction and in my presence.  I have reviewed the chart and discharge instructions (if applicable) and agree that the record reflects my personal performance and is accurate and complete. 05/15/2021   I,Shehryar Baig,acting as a scribe for Nance Pear, NP.,have documented all relevant documentation on the behalf of Nance Pear, NP,as directed by  Nance Pear, NP while in the presence of Nance Pear, NP.   Nance Pear, NP

## 2021-05-15 NOTE — Assessment & Plan Note (Signed)
BP stable. Continue amlodipine 10mg once daily.  °

## 2021-05-15 NOTE — Assessment & Plan Note (Signed)
Declines further rheumatology work up at this time. Continues follow up with orthopedics.

## 2021-05-15 NOTE — Patient Instructions (Signed)
Please complete lab work prior to leaving. Continue to work on healthy diet, exercise and weight loss.  

## 2021-06-02 ENCOUNTER — Other Ambulatory Visit: Payer: Self-pay | Admitting: Family

## 2021-06-11 ENCOUNTER — Encounter: Payer: Self-pay | Admitting: Gastroenterology

## 2021-07-17 ENCOUNTER — Other Ambulatory Visit: Payer: Self-pay

## 2021-07-17 ENCOUNTER — Encounter: Payer: Self-pay | Admitting: Orthopedic Surgery

## 2021-07-17 ENCOUNTER — Ambulatory Visit (INDEPENDENT_AMBULATORY_CARE_PROVIDER_SITE_OTHER): Payer: 59 | Admitting: Orthopedic Surgery

## 2021-07-17 DIAGNOSIS — M1712 Unilateral primary osteoarthritis, left knee: Secondary | ICD-10-CM | POA: Diagnosis not present

## 2021-07-17 NOTE — Progress Notes (Signed)
Office Visit Note   Patient: William Neal           Date of Birth: [birth year]           MRN: [id number] Visit Date: 07/17/2021 Requested by: Debbrah Alar, NP [address] [place],  [place] [address] PCP: Debbrah Alar, NP  Subjective: Chief Complaint  Patient presents with   Left Knee - Follow-up    HPI: William Neal is a 65 year old patient with left knee pain.  Had left knee medial arthritis along with stress reaction earlier this year.  Has done well with that.  Taking Mobic for symptoms.  Has to do ladders every day.  He does use a sleeve.              ROS: All systems reviewed are negative as they relate to the chief complaint within the history of present illness.  Patient denies  fevers or chills.   Assessment & Plan: Visit Diagnoses: No diagnosis found.  Plan: Impression is left knee pain with likely aggravation of arthritis.  Does not look like a stress reaction.  Mild effusion is present.  We talked about aspirating that today versus aspirating and injecting versus repeat clinical recheck in January.  At this time are going to get some more x-rays in January just to evaluate progression of the medial compartment arthritis.  No indication for intervention today.  I do want him to be mindful and careful of how quickly he accelerates his activity.  Follow-Up Instructions: No follow-ups on file.   Orders:  No orders of the defined types were placed in this encounter.  No orders of the defined types were placed in this encounter.     Procedures: No procedures performed   Clinical Data: No additional findings.  Objective: Vital Signs: There were no vitals taken for this visit.  Physical Exam:   Constitutional: Patient appears well-developed HEENT:  Head: Normocephalic Eyes:EOM are normal Neck: Normal range of motion Cardiovascular: Normal rate Pulmonary/chest: Effort normal Neurologic: Patient is alert Skin: Skin is warm Psychiatric:  Patient has normal mood and affect   Ortho Exam: Ortho exam demonstrates mild left knee effusion.  Range of motion is full.  Medial greater than lateral joint line tenderness.  Collateral crucial ligaments are stable.  Pedal pulses palpable.  No groin pain with internal or external rotation of the leg.  Specialty Comments:  No specialty comments available.  Imaging: No results found.   PMFS History: Patient Active Problem List   Diagnosis Date Noted   Essential hypertension 05/14/2020   Rheumatoid arthritis ([place]) 05/14/2020   Other malaise and fatigue 04/24/2014   Skin cyst 04/24/2014   Preventative health care 10/12/2012   History of hepatitis C 10/12/2012   History of colon polyps 10/12/2012   BPH (benign prostatic hyperplasia) 10/12/2012   Past Medical History:  Diagnosis Date   Arthritis    RA and Osteo   BPH (benign prostatic hyperplasia) 10/12/2012   History of hepatitis C    Hypertension     Family History  Problem Relation Age of Onset   Diabetes Mother    Emphysema Father    Hypertension Maternal Uncle    Coronary artery disease Brother        multiple stents, 1/2 brother   Heart disease Other 85       died in his sleep, ?presumed heart attack   Hypertension Sister    Colon cancer Neg Hx    Esophageal cancer Neg  Hx    Rectal cancer Neg Hx    Stomach cancer Neg Hx    Colon polyps Neg Hx     Past Surgical History:  Procedure Laterality Date   COLONOSCOPY     no prior surgery     POLYPECTOMY     Social History   Occupational History   Not on file  Tobacco Use   Smoking status: Former    Packs/day: 0.50    Years: 40.00    Pack years: 20.00    Types: Cigarettes    Quit date: 02/07/2020    Years since quitting: 1.4   Smokeless tobacco: Never  Vaping Use   Vaping Use: Former  Substance and Sexual Activity   Alcohol use: Not Currently    Comment: former heavy alcohol user, 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

## 2021-07-19 ENCOUNTER — Other Ambulatory Visit: Payer: Self-pay | Admitting: Family

## 2021-08-26 ENCOUNTER — Encounter: Payer: Self-pay | Admitting: Family

## 2021-08-26 MED ORDER — AMLODIPINE BESYLATE 5 MG PO TABS: 5.0000 mg | ORAL_TABLET | Freq: Every day | ORAL | 0 refills | Status: DC

## 2021-08-26 MED ORDER — TAMSULOSIN HCL 0.4 MG PO CAPS: 0.4000 mg | ORAL_CAPSULE | Freq: Every day | ORAL | 0 refills | Status: DC

## 2021-08-26 MED ORDER — MELOXICAM 7.5 MG PO TABS: 7.5000 mg | ORAL_TABLET | Freq: Every day | ORAL | 0 refills | Status: DC

## 2021-10-23 ENCOUNTER — Ambulatory Visit: Payer: 59 | Admitting: Orthopedic Surgery

## 2021-11-29 ENCOUNTER — Other Ambulatory Visit: Payer: Self-pay | Admitting: Family

## 2021-12-13 ENCOUNTER — Ambulatory Visit (INDEPENDENT_AMBULATORY_CARE_PROVIDER_SITE_OTHER): Payer: Commercial Managed Care - PPO | Admitting: Orthopedic Surgery

## 2021-12-13 ENCOUNTER — Ambulatory Visit (INDEPENDENT_AMBULATORY_CARE_PROVIDER_SITE_OTHER): Payer: Commercial Managed Care - PPO

## 2021-12-13 ENCOUNTER — Ambulatory Visit: Payer: Commercial Managed Care - PPO

## 2021-12-13 DIAGNOSIS — M19049 Primary osteoarthritis, unspecified hand: Secondary | ICD-10-CM

## 2021-12-13 DIAGNOSIS — M79641 Pain in right hand: Secondary | ICD-10-CM

## 2021-12-13 DIAGNOSIS — M1712 Unilateral primary osteoarthritis, left knee: Secondary | ICD-10-CM

## 2021-12-13 DIAGNOSIS — M19041 Primary osteoarthritis, right hand: Secondary | ICD-10-CM | POA: Diagnosis not present

## 2021-12-14 ENCOUNTER — Encounter: Payer: Self-pay | Admitting: Orthopedic Surgery

## 2021-12-14 NOTE — Progress Notes (Signed)
? ?Office Visit Note ?  ?Patient: William Neal           ?Date of Birth: 1956/03/15           ?MRN: [id number] ?Visit Date: 12/13/2021 ?Requested by: William Alar, NP ?[address] ?[place],  [place] [id number] ?PCP: William Alar, NP ? ?Subjective: ?Chief Complaint  ?Patient presents with  ?? Left Knee - Pain  ? ? ?HPI: William Neal is a 66 y.o. male who presents to the office complaining of left knee and right hand pain.  Patient has noticed increased left knee pain over the last several months.  Last visit was July 17, 2021 and he opted for no injection at that time.  He states that he would like to try knee injection today.  No repeat incident of inability to bear weight such as when he had his stress fracture of the proximal tibia last year.  No new injury.  Does feel like the knee wants to give out on him at times.  He has noticed some swelling in the knee.  He has history of moderate knee arthritis primarily in the medial compartment.  He also has history of rheumatoid arthritis for which he sees Dr William Neal; he does not have to take any biologic medication or methotrexate.  She takes turmeric and Mobic. ? ?In addition to the left knee pain, he complains of right hand pain that he localizes to the base of his right thumb.  He denies any triggering or mechanical symptoms.  Just states that he has severe pain with primarily gripping and pinch.  No prior history of surgery or injection to the right hand.  No numbness or tingling.  No pain elsewhere, just at the base of the right thumb..   ?             ?ROS: All systems reviewed are negative as they relate to the chief complaint within the history of present illness.  Patient denies fevers or chills. ? ?Assessment & Plan: ?Visit Diagnoses:  ?1. Unilateral primary osteoarthritis, left knee   ?2. Pain of right hand   ? ? ?Plan: Patient is a 66 year old male who presents for evaluation of left knee pain and right hand pain.  He has  history of left knee osteoarthritis.  No inability to bear weight such as when he was dealing with nondisplaced tibial fracture last year.  He does have a moderate effusion on exam today and would like to try knee injection.  He is planning on retiring at the end of next year.  After discussion, patient decided to proceed with left knee injection.  Tolerated the procedure well.  Left knee was aspirated successfully with nonpurulent synovial fluid drained from the knee. ? 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  Discussed options available to patient and he would like to try injection in this joint.  Ultrasound was utilized in order to deliver injection into the for [facility] joint successfully.  Patient tolerated the procedure well.  He will follow-up with the office in 6 months for clinical recheck. ? ?Follow-Up Instructions: No follow-ups on file.  ? ?Orders:  ?Orders Placed This Encounter  ?Procedures  ?? XR KNEE 3 VIEW LEFT  ?? XR Hand Complete Right  ?? US Guided Needle Placement - No Linked Charges  ? ?No orders of the defined types were placed in this encounter. ? ? ? ? Procedures: ?Small Joint Inj: R  thumb CMC on 12/13/2021 6:27 PM ?Indications: pain ?Details: 25 G needle, ultrasound-guided radial approach ? ?Spinal Needle: No ? ?Medications: 3 mL lidocaine 1 %; 0.33 mL bupivacaine 0.25 %; 13.33 mg methylPREDNISolone acetate 40 MG/ML ?Outcome: tolerated well, no immediate complications ?Procedure, treatment alternatives, risks and benefits explained, specific risks discussed. Consent was given by the patient. Immediately prior to procedure a time out was called to verify the correct patient, procedure, equipment, support staff and site/side marked as required. Patient was prepped and draped in the usual sterile fashion.  ? ? ?Large Joint Inj: L knee on 12/13/2021 6:28 PM ?Indications: diagnostic evaluation, joint swelling and pain ?Details: 18 G 1.5 in  needle, superolateral approach ? ?Arthrogram: No ? ?Medications: 5 mL lidocaine 1 %; 40 mg methylPREDNISolone acetate 40 MG/ML; 4 mL bupivacaine 0.25 % ?Outcome: tolerated well, no immediate complications ?Procedure, treatment alternatives, risks and benefits explained, specific risks discussed. Consent was given by the patient. Immediately prior to procedure a time out was called to verify the correct patient, procedure, equipment, support staff and site/side marked as required. Patient was prepped and draped in the usual sterile fashion.  ? ? ? ? ?Clinical Data: ?No additional findings. ? ?Objective: ?Vital Signs: There were no vitals taken for this visit. ? ?Physical Exam:  ?Constitutional: Patient appears well-developed ?HEENT:  ?Head: Normocephalic ?Eyes:EOM are normal ?Neck: Normal range of motion ?Cardiovascular: Normal rate ?Pulmonary/chest: Effort normal ?Neurologic: Patient is alert ?Skin: Skin is warm ?Psychiatric: Patient has normal mood and affect ? ?Ortho Exam: Ortho exam demonstrates left knee with moderate effusion.  Tenderness over the medial joint line moderately.  No tenderness over the lateral joint line significantly.  No calf tenderness.  Negative Homans' sign.  Able to perform straight leg raise.  Range of motion from 3 degrees extension to 110 degrees of knee flexion.  No pain with hip range of motion.  Negative straight leg raise. ? ?Right hand with no triggering observed.  Able to make full composite fist.  Moderate pain with palpation at the first [facility] joint and increased pain in this location with hyper adduction of the thumb.  Excellent opposition strength with some mildly increased pain with testing strength.  Pinch grip strength between the thumb and index finger reproduces severe pain.  No skin changes or masses noted. ? ?Specialty Comments:  ?No specialty comments available. ? ?Imaging: ?No results found. ? ? ?PMFS History: ?Patient Active Problem List  ? Diagnosis Date Noted  ??  Essential hypertension 05/14/2020  ?? Rheumatoid arthritis ([place]) 05/14/2020  ?? Other malaise and fatigue 04/24/2014  ?? Skin cyst 04/24/2014  ?? Preventative health care 10/12/2012  ?? History of hepatitis C 10/12/2012  ?? History of colon polyps 10/12/2012  ?? BPH (benign prostatic hyperplasia) 10/12/2012  ? ?Past Medical History:  ?Diagnosis Date  ?? Arthritis   ? RA and Osteo  ?? BPH (benign prostatic hyperplasia) 10/12/2012  ?? History of hepatitis C   ?? Hypertension   ?  ?Family History  ?Problem Relation Age of Onset  ?? Diabetes Mother   ?? Emphysema Father   ?? Hypertension Maternal Uncle   ?? Coronary artery disease Brother   ?     multiple stents, 1/2 brother  ?? Heart disease Other 20  ?     died in his sleep, ?presumed heart attack  ?? Hypertension Sister   ?? Colon cancer Neg Hx   ?? Esophageal cancer Neg Hx   ?? Rectal cancer Neg Hx   ??  Stomach cancer Neg Hx   ?? Colon polyps Neg Hx   ?  ?Past Surgical History:  ?Procedure Laterality Date  ?? COLONOSCOPY    ?? no prior surgery    ?? POLYPECTOMY    ? ?Social History  ? ?Occupational History  ?? Not on file  ?Tobacco Use  ?? Smoking status: Former  ?  Packs/day: 0.50  ?  Years: 40.00  ?  Pack years: 20.00  ?  Types: Cigarettes  ?  Quit date: 02/07/2020  ?  Years since quitting: 1.8  ?? Smokeless tobacco: Never  ?Vaping Use  ?? Vaping Use: Former  ?Substance and Sexual Activity  ?? Alcohol use: Not Currently  ?  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 Drug use: Never  ?? Sexual activity: Not on file  ? ? ? ? ?  ?

## 2021-12-17 MED ORDER — LIDOCAINE HCL 1 % IJ SOLN
5.0000 mL | INTRAMUSCULAR | Status: AC | PRN
  Administered 2021-12-13: 5 mL

## 2021-12-17 MED ORDER — BUPIVACAINE HCL 0.25 % IJ SOLN
4.0000 mL | INTRAMUSCULAR | Status: AC | PRN
  Administered 2021-12-13: 4 mL via INTRA_ARTICULAR

## 2021-12-17 MED ORDER — METHYLPREDNISOLONE ACETATE 40 MG/ML IJ SUSP
13.3300 mg | INTRAMUSCULAR | Status: AC | PRN
  Administered 2021-12-13: 13.33 mg via INTRA_ARTICULAR

## 2021-12-17 MED ORDER — BUPIVACAINE HCL 0.25 % IJ SOLN
0.3300 mL | INTRAMUSCULAR | Status: AC | PRN
  Administered 2021-12-13: .33 mL via INTRA_ARTICULAR

## 2021-12-17 MED ORDER — METHYLPREDNISOLONE ACETATE 40 MG/ML IJ SUSP
40.0000 mg | INTRAMUSCULAR | Status: AC | PRN
  Administered 2021-12-13: 40 mg via INTRA_ARTICULAR

## 2021-12-17 MED ORDER — LIDOCAINE HCL 1 % IJ SOLN
3.0000 mL | INTRAMUSCULAR | Status: AC | PRN
  Administered 2021-12-13: 3 mL

## 2022-01-10 ENCOUNTER — Other Ambulatory Visit: Payer: Self-pay

## 2022-01-10 MED ORDER — TAMSULOSIN HCL 0.4 MG PO CAPS: 0.4000 mg | ORAL_CAPSULE | Freq: Every day | ORAL | 0 refills | Status: DC

## 2022-02-17 ENCOUNTER — Encounter: Payer: Self-pay | Admitting: Family

## 2022-02-17 ENCOUNTER — Other Ambulatory Visit: Payer: Self-pay

## 2022-02-17 MED ORDER — AMLODIPINE BESYLATE 5 MG PO TABS: 5.0000 mg | ORAL_TABLET | Freq: Every day | ORAL | 0 refills | Status: DC

## 2022-03-25 ENCOUNTER — Other Ambulatory Visit: Payer: Self-pay | Admitting: Family

## 2022-03-25 NOTE — Telephone Encounter (Signed)
Patient is overdue for follow up.  Please contact pt to schedule a follow up visit.

## 2022-03-25 NOTE — Telephone Encounter (Signed)
Patient scheduled to come in 04/11/22

## 2022-04-11 ENCOUNTER — Ambulatory Visit (INDEPENDENT_AMBULATORY_CARE_PROVIDER_SITE_OTHER): Payer: Commercial Managed Care - PPO | Admitting: Family

## 2022-04-11 VITALS — BP 136/61 | HR 50 | Temp 98.2°F | Resp 16 | Ht 71.0 in | Wt 211.0 lb

## 2022-04-11 DIAGNOSIS — K635 Polyp of colon: Secondary | ICD-10-CM

## 2022-04-11 DIAGNOSIS — I10 Essential (primary) hypertension: Secondary | ICD-10-CM | POA: Diagnosis not present

## 2022-04-11 DIAGNOSIS — E781 Pure hyperglyceridemia: Secondary | ICD-10-CM | POA: Diagnosis not present

## 2022-04-11 DIAGNOSIS — N400 Enlarged prostate without lower urinary tract symptoms: Secondary | ICD-10-CM

## 2022-04-11 DIAGNOSIS — Z8619 Personal history of other infectious and parasitic diseases: Secondary | ICD-10-CM

## 2022-04-11 LAB — COMPREHENSIVE METABOLIC PANEL
ALT: 21 U/L (ref 0–53)
AST: 20 U/L (ref 0–37)
Albumin: 4.5 g/dL (ref 3.5–5.2)
Alkaline Phosphatase: 97 U/L (ref 39–117)
BUN: 16 mg/dL (ref 6–23)
CO2: 29 mEq/L (ref 19–32)
Calcium: 9.6 mg/dL (ref 8.4–10.5)
Chloride: 107 mEq/L (ref 96–112)
Creatinine, Ser: 0.95 mg/dL (ref 0.40–1.50)
GFR: 85.07 mL/min (ref >60.00)
Glucose, Bld: 110 mg/dL — ABNORMAL HIGH (ref 70–99)
Potassium: 4.2 mEq/L (ref 3.5–5.1)
Sodium: 143 mEq/L (ref 135–145)
Total Bilirubin: 0.4 mg/dL (ref 0.2–1.2)
Total Protein: 6.6 g/dL (ref 6.0–8.3)

## 2022-04-11 LAB — LIPID PANEL
Cholesterol: 172 mg/dL (ref 0–200)
HDL: 28.6 mg/dL — ABNORMAL LOW (ref >39.00)
LDL Cholesterol: 119 mg/dL — ABNORMAL HIGH (ref 0–99)
NonHDL: 143.8
Total CHOL/HDL Ratio: 6
Triglycerides: 124 mg/dL (ref 0.0–149.0)
VLDL: 24.8 mg/dL (ref 0.0–40.0)

## 2022-04-11 MED ORDER — TAMSULOSIN HCL 0.4 MG PO CAPS
0.4000 mg | ORAL_CAPSULE | Freq: Every day | ORAL | 1 refills | Status: DC
Start: 2022-04-11 — End: 2022-09-29

## 2022-04-11 MED ORDER — AMLODIPINE BESYLATE 5 MG PO TABS
5.0000 mg | ORAL_TABLET | Freq: Every day | ORAL | 1 refills | Status: DC
Start: 2022-04-11 — End: 2022-10-26

## 2022-04-11 MED ORDER — MELOXICAM 7.5 MG PO TABS: 7.5000 mg | ORAL_TABLET | Freq: Every day | ORAL | 1 refills | Status: DC

## 2022-04-11 NOTE — Progress Notes (Signed)
Subjective:   By signing my name below, I, Carylon Perches, attest that this documentation has been prepared under the direction and in the presence of Karie Chimera, NP 04/11/2022   Patient ID: William Neal, male    DOB: Mar 05, 1956, 66 y.o.   MRN: [id number]  Chief Complaint  Patient presents with   Hypertension    Here for follow up   Arthritis    Complains of arthritis flare up, pain on knees and hands    HPI Patient is in today for an office visit   Refills: He is requesting a refill of 0.4 Mg of Flomax, 7.5 Mg of Meloxicam and 5 mg of Amlodipine.  Blood Pressure: As of today's visit, his blood pressure is normal. He is currently taking 5 Mg of Amlodipine. He denies of any problems while on the medication.  BP Readings from Last 3 Encounters:  04/11/22 136/61  05/15/21 122/72  11/12/20 (!) 141/78   Pulse Readings from Last 3 Encounters:  04/11/22 (!) 50  05/15/21 (!) 57  11/12/20 61   Arthritis: He states that the arthritis in his knees, wrists and hands are worsening. He has a handicap sticker for his symptoms and is requesting to get it renewed. He is taking 7.5 Mg of Meloxicam daily. He is also taking Tumeric. He has previously went to rheumatology and states that the medications that he was prescribed would cause him to become sick.  Urination: He is currently taking 0.4 Mg of Flomax and reports that his symptoms are resolved.  Colonoscopy: He is overdue for a colonoscopy.  Immunizations: He is not interested in receiving the Shingles vaccine.  HepC: He is interested in getting his HepC viral load retested- he is s/p treatment. Stye in Eye: He reports that he had a previous stye in his eye and proceeded to go to the [facility] to get it resolved.   Health Maintenance Due  Topic Date Due   HIV Screening  Never done   COLONOSCOPY (Pts 45-23yr Insurance coverage will need to be confirmed)  11/06/2021    Past Medical History:  Diagnosis Date   Arthritis     RA and Osteo   BPH (benign prostatic hyperplasia) 10/12/2012   History of hepatitis C    Hypertension     Past Surgical History:  Procedure Laterality Date   COLONOSCOPY     no prior surgery     POLYPECTOMY      Family History  Problem Relation Age of Onset   Diabetes Mother    Emphysema Father    Hypertension Maternal Uncle    Coronary artery disease Brother        multiple stents, 1/2 brother   Heart disease Other 82      died in his sleep, ?presumed heart attack   Hypertension Sister    Colon cancer Neg Hx    Esophageal cancer Neg Hx    Rectal cancer Neg Hx    Stomach cancer Neg Hx    Colon polyps Neg Hx     Social History   Socioeconomic History   Marital status: Married    Spouse name: Not on file   Number of children: Not on file   Years of education: Not on file   Highest education level: Not on file  Occupational History   Not on file  Tobacco Use   Smoking status: Former    Packs/day: 0.50    Years: 40.00    Total pack years: 20.00  Types: Cigarettes    Quit date: 02/07/2020    Years since quitting: 2.1   Smokeless tobacco: Never  Vaping Use   Vaping Use: Former  Substance and Sexual Activity   Alcohol use: Not Currently    Comment: former heavy alcohol user, 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  Other Topics Concern   Not on file  Social History Narrative   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     amLODipine (NORVASC) 5 MG tablet Take 1 tablet (5  mg total) by mouth daily. 90 tablet 0   meloxicam (MOBIC) 7.5 MG tablet Take 1 tablet by mouth once daily 30 tablet 0   tamsulosin (FLOMAX) 0.4 MG CAPS capsule Take 1 capsule (0.4 mg total) by mouth daily. 90 capsule 0   Facility-Administered Medications Prior to Visit  Medication Dose Route Frequency Provider Last Rate Last Admin   0.9 %  sodium chloride infusion  500 mL Intravenous Continuous Milus Banister, MD        No Known Allergies  ROS    See HPI  Objective:    Physical Exam Constitutional:      General: He is not in acute distress.    Appearance: Normal appearance. He is not ill-appearing.  HENT:     Head: Normocephalic and atraumatic.     Right Ear: External ear normal.     Left Ear: External ear normal.  Eyes:     Extraocular Movements: Extraocular movements intact.     Pupils: Pupils are equal, round, and reactive to light.  Cardiovascular:     Rate and Rhythm: Normal rate and regular rhythm.     Heart sounds: Normal heart sounds. No murmur heard.    No gallop.  Pulmonary:     Effort: Pulmonary effort is normal. No respiratory distress.     Breath sounds: Normal breath sounds. No wheezing or rales.  Musculoskeletal:     Right lower leg: 1+ Edema present.     Left lower leg: 1+ Edema present.  Skin:    General: Skin is warm and dry.  Neurological:     Mental Status: He is alert and oriented to person, place, and time.  Psychiatric:        Mood and Affect: Mood normal.        Behavior: Behavior normal.        Judgment: Judgment normal.     BP 136/61 (BP Location: Right Arm, Patient Position: Sitting, Cuff Size: Large)   Pulse (!) 50   Temp 98.2 F (36.8 C) (Oral)   Resp 16   Ht _0  (1.803 m)   Wt 211 lb (95.7 kg)   SpO2 95%   BMI 29.43 kg/m  Wt Readings from Last 3 Encounters:  04/11/22 211 lb (95.7 kg)  05/15/21 213 lb (96.6 kg)  11/12/20 213 lb (96.6 kg)       Assessment & Plan:  Problem List Items Addressed This Visit        Unprioritized   History of hepatitis C   Relevant Orders   HCV RNA quant   Essential hypertension    BP Readings from Last 3 Encounters:  04/11/22 136/61  05/15/21 122/72  11/12/20 (!) 141/78  Stable on amlodipine 61m once daily.  Continue same.        Relevant Medications   amLODipine (NORVASC) 5 MG tablet   Other Relevant Orders   Comp Met (CMET)   BPH (benign prostatic hyperplasia)    Stable on flomax.  Continue same.  Lab Results  Component Value Date   PSA 0.45 05/14/2020   PSA 0.50 03/27/2017   PSA 0.62 03/10/2016         Relevant Medications   tamsulosin (FLOMAX) 0.4 MG CAPS capsule   Other Visit Diagnoses     Polyp of colon, unspecified part of colon, unspecified type    -  Primary   Relevant Orders   Ambulatory referral to Gastroenterology   Hypertriglyceridemia       Relevant Medications   amLODipine (NORVASC) 5 MG tablet   Other Relevant Orders   Lipid panel      Meds ordered this encounter  Medications   tamsulosin (FLOMAX) 0.4 MG CAPS capsule    Sig: Take 1 capsule (0.4 mg total) by mouth daily.    Dispense:  90 capsule    Refill:  1    Order Specific Question:   Supervising Provider    Answer:   BPenni HomansA [[id number]]   meloxicam (MOBIC) 7.5 MG tablet    Sig: Take 1 tablet (7.5 mg total) by mouth daily.    Dispense:  90 tablet    Refill:  1    Order Specific Question:   Supervising Provider    Answer:   BPenni HomansA [[id number]]   amLODipine (NORVASC) 5 MG tablet    Sig: Take 1 tablet (5 mg total) by mouth daily.    Dispense:  90 tablet    Refill:  1    Order Specific Question:   Supervising Provider    Answer:   BPenni HomansA [[id number]]    I, MNance Pear NP, personally preformed the services described in this documentation.  All medical record entries made by the scribe were at my direction and in my presence.  I have reviewed the chart and discharge instructions (if applicable) and agree that the record reflects my personal  performance and is accurate and complete. 04/11/2022   I,Amber Collins,acting as a sEducation administratorfor MNance Pear NP.,have documented all relevant documentation on the behalf of MNance Pear NP,as directed by  MNance Pear NP while in the presence of MNance Pear NP.    MNance Pear NP

## 2022-04-11 NOTE — Assessment & Plan Note (Signed)
Stable on flomax.  Continue same.  Lab Results  Component Value Date   PSA 0.45 05/14/2020   PSA 0.50 03/27/2017   PSA 0.62 03/10/2016

## 2022-04-11 NOTE — Assessment & Plan Note (Addendum)
BP Readings from Last 3 Encounters:  04/11/22 136/61  05/15/21 122/72  11/12/20 (!) 141/78   Stable on amlodipine '5mg'$  once daily.  Continue same.

## 2022-04-12 LAB — HCV RNA QUANT: Hepatitis C Quantitation: NOT DETECTED IU/mL

## 2022-06-13 ENCOUNTER — Ambulatory Visit: Payer: Commercial Managed Care - PPO | Admitting: Orthopedic Surgery

## 2022-07-12 IMAGING — MR MR KNEE*L* W/O CM
4 of 7 series · 21 of 40 positions shown · non-contrast
Comparison: None.

CLINICAL DATA: Left knee pain.  Anterior pain for several months.

EXAM:
MRI OF THE LEFT KNEE WITHOUT CONTRAST
TECHNIQUE: Multiplanar, multisequence MR imaging of the knee was performed. No
intravenous contrast was administered.

[Series 3: T2 fat-sat · axial · 4.0mm · 0.50mm/px · z∈[-91,+24]mm · 5 of 24 slices shown]
[im 1/24]
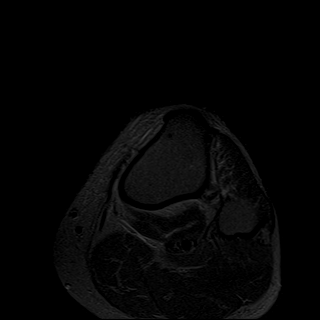
[im 6/24]
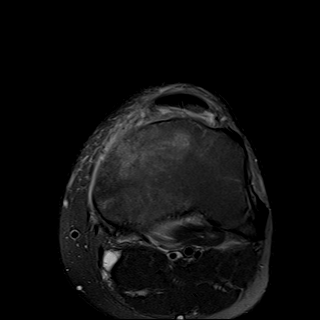
[im 12/24]
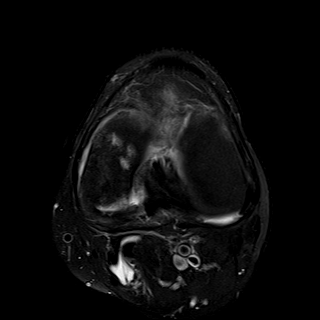
[im 18/24]
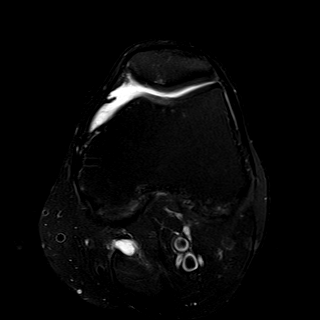
[im 24/24]
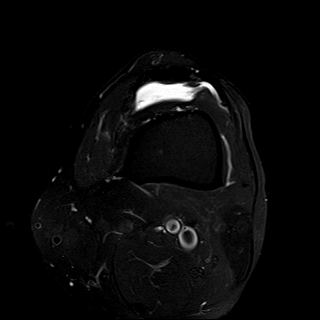

[Series 7: PD fat-sat · sagittal · 3.0mm · 0.29mm/px · 7 of 30 slices shown (1 of 3)]
[im 1/30]
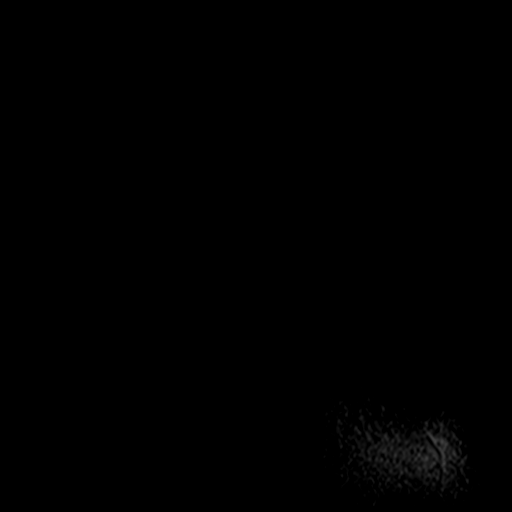
[im 5/30]
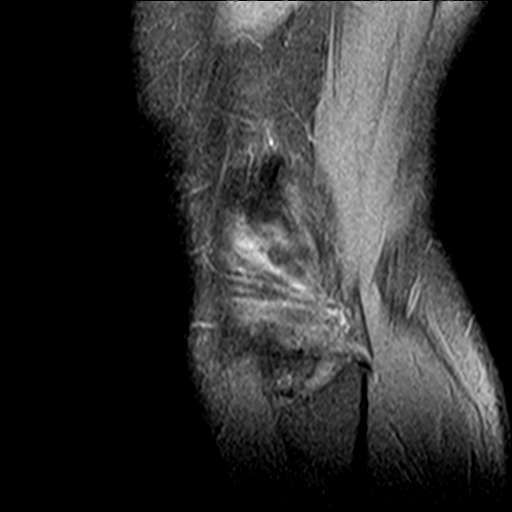
[im 10/30]
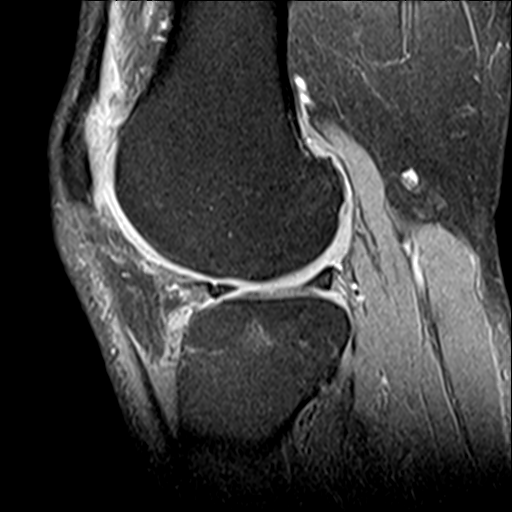
[im 15/30]
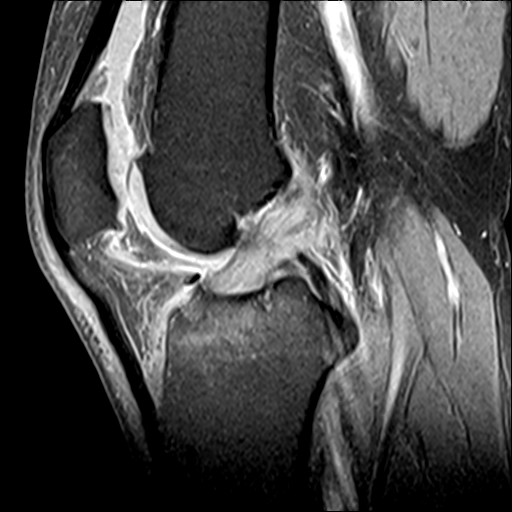
[im 20/30]
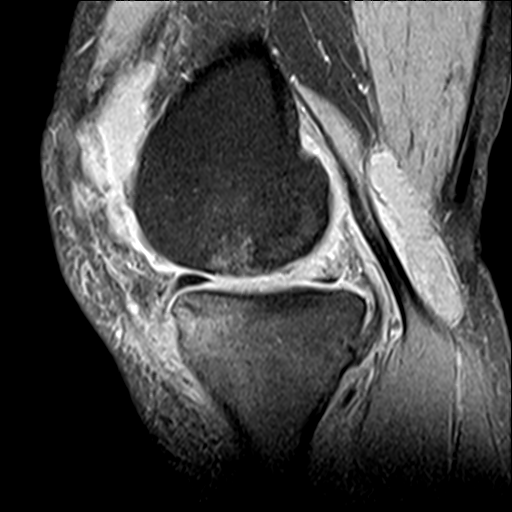
[im 25/30]
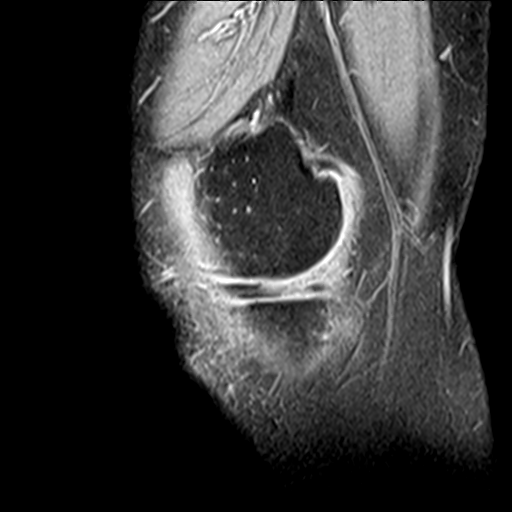
[im 30/30]
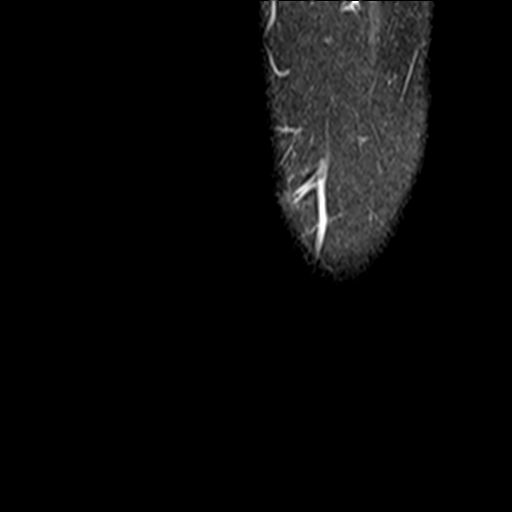

[Series 8: PD fat-sat · coronal · 3.0mm · 0.29mm/px · 7 of 31 slices shown (2 of 3)]
[im 1/31]
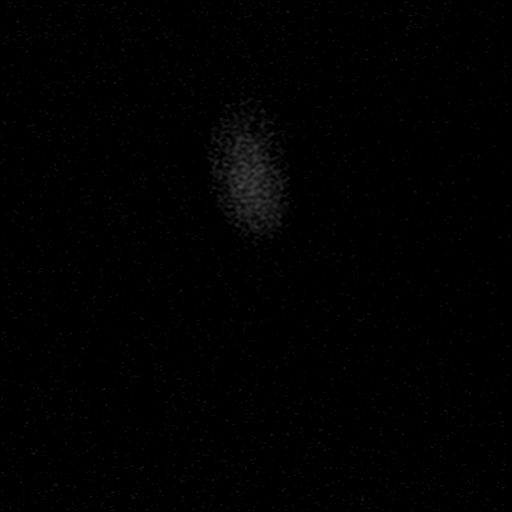
[im 6/31]
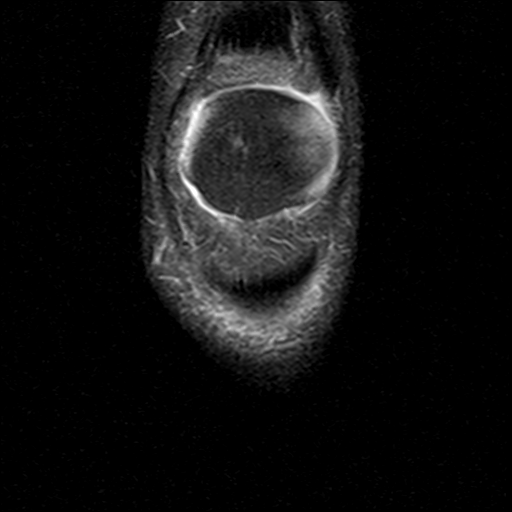
[im 11/31]
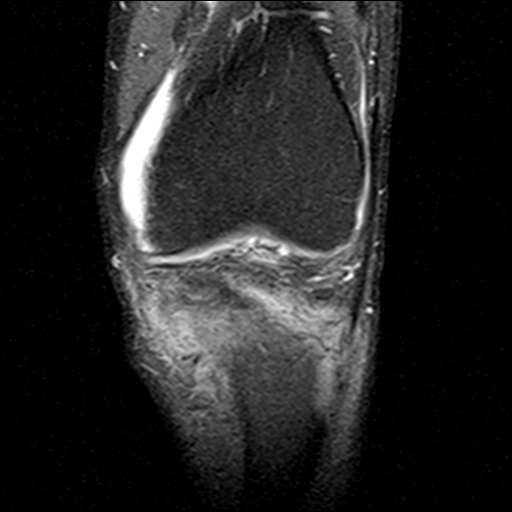
[im 16/31]
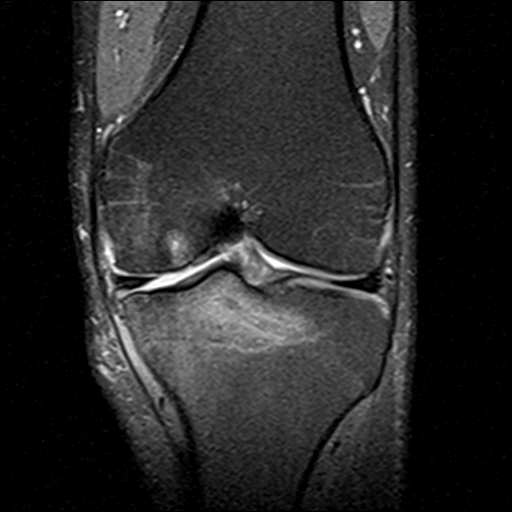
[im 21/31]
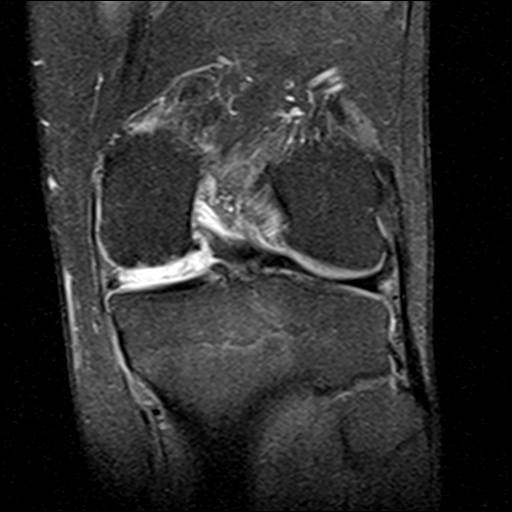
[im 26/31]
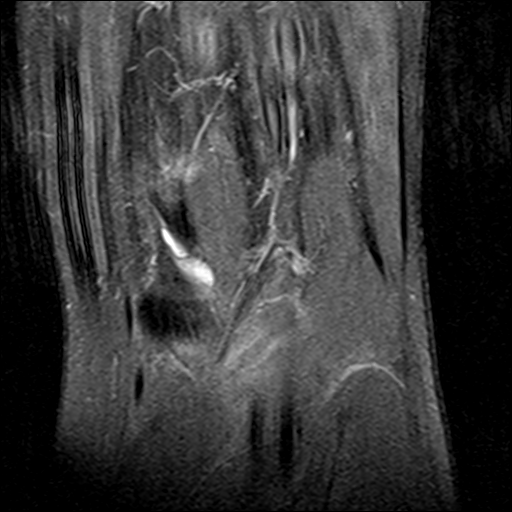
[im 31/31]
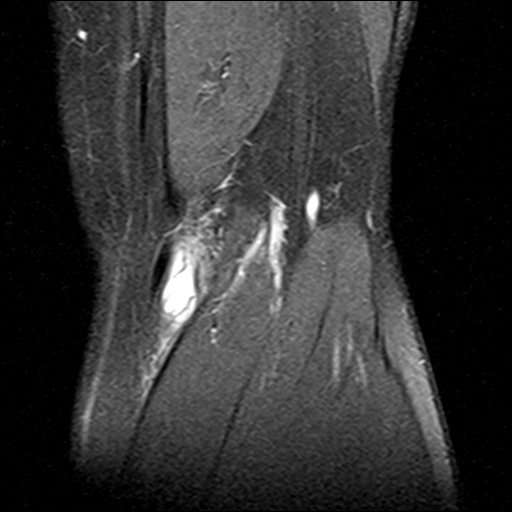

[Series 9: PD fat-sat · oblique · 2.0mm · 0.29mm/px · 2 of 11 slices shown (3 of 3)]
[im 1/11]
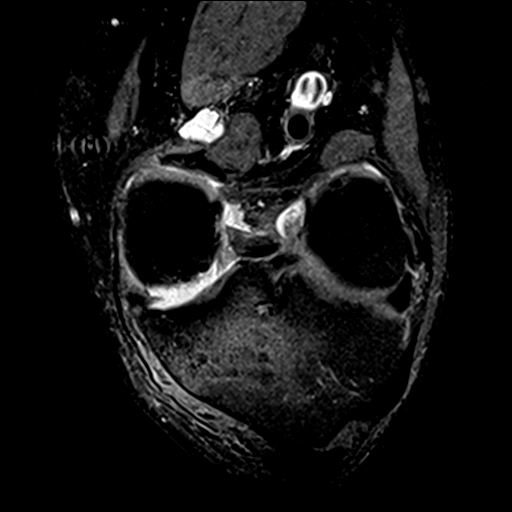
[im 11/11]
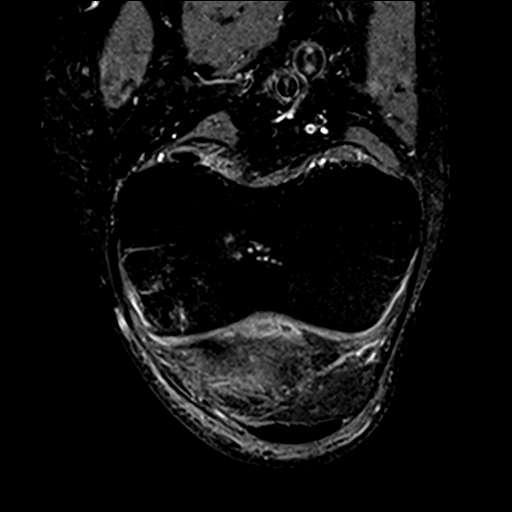

[21 of 40 positions shown; findings below may reference images not displayed]

FINDINGS: MENISCI

Medial: Large radial tear of the body and posterior horn of the
medial meniscus.

Lateral: Intact.

LIGAMENTS

Cruciates: Intact ACL. ACL is expanded and increased in signal
consistent with mucinous degeneration. Intact PCL.

Collaterals: Medial collateral ligament is intact. Lateral
collateral ligament complex is intact.

CARTILAGE

Patellofemoral: Cartilage fissuring of the patellar apex and medial
trochlea. Partial-thickness cartilage loss of the medial trochlea.

Medial: Full-thickness cartilage loss of the medial femorotibial
compartment with subchondral reactive marrow changes.

Lateral:  No chondral defect.

JOINT: Large joint effusion. Edema in Hoffa's fat. No plical
thickening.

POPLITEAL FOSSA: Popliteus tendon is intact. Small Baker's cyst.

EXTENSOR MECHANISM: Intact quadriceps tendon. Intact patellar
tendon. Intact lateral patellar retinaculum. Intact medial patellar
retinaculum. Intact MPFL.

BONES: Subchondral bone marrow edema in the anterior aspect of the
medial tibial plateau with a linear low signal component consistent
with a nondepressed, nondisplaced no aggressive osseous lesion.
Fracture.

Other: No fluid collection or hematoma. Muscles are normal.
IMPRESSION: 1. Large radial tear of the body and posterior horn of the medial
meniscus.
2. Full-thickness cartilage loss of the medial femorotibial
compartment with subchondral reactive marrow changes.
3. Nondepressed, nondisplaced fracture of the anterior aspect of the
medial tibial plateau not involving the articular surface
4. Large joint effusion.

## 2022-09-29 ENCOUNTER — Other Ambulatory Visit: Payer: Self-pay | Admitting: Family

## 2022-10-17 ENCOUNTER — Encounter: Payer: Self-pay | Admitting: Family

## 2022-10-17 ENCOUNTER — Ambulatory Visit (INDEPENDENT_AMBULATORY_CARE_PROVIDER_SITE_OTHER): Payer: Commercial Managed Care - PPO | Admitting: Family

## 2022-10-17 VITALS — BP 138/85 | HR 58 | Temp 98.2°F | Resp 18 | Ht 71.0 in | Wt 210.0 lb

## 2022-10-17 DIAGNOSIS — Z0000 Encounter for general adult medical examination without abnormal findings: Secondary | ICD-10-CM

## 2022-10-17 DIAGNOSIS — N400 Enlarged prostate without lower urinary tract symptoms: Secondary | ICD-10-CM | POA: Diagnosis not present

## 2022-10-17 DIAGNOSIS — Z23 Encounter for immunization: Secondary | ICD-10-CM

## 2022-10-17 DIAGNOSIS — M069 Rheumatoid arthritis, unspecified: Secondary | ICD-10-CM | POA: Diagnosis not present

## 2022-10-17 DIAGNOSIS — R739 Hyperglycemia, unspecified: Secondary | ICD-10-CM

## 2022-10-17 DIAGNOSIS — Z125 Encounter for screening for malignant neoplasm of prostate: Secondary | ICD-10-CM

## 2022-10-17 DIAGNOSIS — K635 Polyp of colon: Secondary | ICD-10-CM

## 2022-10-17 DIAGNOSIS — I10 Essential (primary) hypertension: Secondary | ICD-10-CM

## 2022-10-17 LAB — LIPID PANEL
Cholesterol: 171 mg/dL (ref 0–200)
HDL: 26.5 mg/dL — ABNORMAL LOW (ref >39.00)
LDL Cholesterol: 114 mg/dL — ABNORMAL HIGH (ref 0–99)
NonHDL: 144.83
Total CHOL/HDL Ratio: 6
Triglycerides: 152 mg/dL — ABNORMAL HIGH (ref 0.0–149.0)
VLDL: 30.4 mg/dL (ref 0.0–40.0)

## 2022-10-17 LAB — COMPREHENSIVE METABOLIC PANEL
ALT: 22 U/L (ref 0–53)
AST: 21 U/L (ref 0–37)
Albumin: 4.2 g/dL (ref 3.5–5.2)
Alkaline Phosphatase: 101 U/L (ref 39–117)
BUN: 13 mg/dL (ref 6–23)
CO2: 25 mEq/L (ref 19–32)
Calcium: 9.4 mg/dL (ref 8.4–10.5)
Chloride: 107 mEq/L (ref 96–112)
Creatinine, Ser: 0.91 mg/dL (ref 0.40–1.50)
GFR: 89.25 mL/min (ref >60.00)
Glucose, Bld: 108 mg/dL — ABNORMAL HIGH (ref 70–99)
Potassium: 4 mEq/L (ref 3.5–5.1)
Sodium: 141 mEq/L (ref 135–145)
Total Bilirubin: 0.6 mg/dL (ref 0.2–1.2)
Total Protein: 6.3 g/dL (ref 6.0–8.3)

## 2022-10-17 LAB — HEMOGLOBIN A1C: Hgb A1c MFr Bld: 5.9 % (ref 4.6–6.5)

## 2022-10-17 LAB — PSA: PSA: 0.82 ng/mL (ref 0.10–4.00)

## 2022-10-17 NOTE — Assessment & Plan Note (Signed)
Stable on flomax.

## 2022-10-17 NOTE — Assessment & Plan Note (Signed)
BP Readings from Last 3 Encounters:  10/17/22 (!) 144/70  04/11/22 136/61  05/15/21 122/72

## 2022-10-17 NOTE — Assessment & Plan Note (Addendum)
Td today.   Wt Readings from Last 3 Encounters:  10/17/22 210 lb (95.3 kg)  04/11/22 211 lb (95.7 kg)  05/15/21 213 lb (96.6 kg)   Discussed increasing exercise. Diet needs improvement. Discussed specifically limiting junk food/sodium.  Td today. Colo is overdue. Will

## 2022-10-17 NOTE — Progress Notes (Signed)
Subjective:   By signing my name below, I, Shehryar Baig, attest that this documentation has been prepared under the direction and in the presence of Debbrah Alar, NP. 10/17/2022   Patient ID: William Neal, male    DOB: [birth year], 67 y.o.   MRN: [id number]  Chief Complaint  Patient presents with   Annual Exam    HPI Patient is in today for a comprehensive physical exam.   Mole: He reports a mole on the left side that has not changed since her found it.   Blood pressure: His blood pressure is elevated during this visit. He continues taking 5 mg amlodipine daily PO at night.  BP Readings from Last 3 Encounters:  10/17/22 138/85  04/11/22 136/61  05/15/21 122/72   Pulse Readings from Last 3 Encounters:  10/17/22 (!) 58  04/11/22 (!) 50  05/15/21 (!) 57   Cholesterol: His last cholesterol levels were stable. Lab Results  Component Value Date   CHOL 172 04/11/2022   HDL 28.60 (L) 04/11/2022   LDLCALC 119 (H) 04/11/2022   LDLDIRECT 117.0 05/15/2021   TRIG 124.0 04/11/2022   CHOLHDL 6 04/11/2022   Urination: He is urinating normally while taking Flomax.   Joint pain: He continues taking meloxicam for joint pain and finds relief while taking it.   Rheumatology: He is not following up with a rheumatologist for rheumatoid arthritis and prefers not to at this time.   Acute: He denies fever, unexpected weight change, adenopathy, sinus pain, sore throat, visual disturbance, chest pain, palpitations, leg swelling, cough, shortness of breath, wheezing, nausea, vomiting, diarrhea, constipation, blood in stool, dysuria, frequency, hematuria, new muscle pain, new joint pain, headaches, depression or anxiety at this time.  Social history: He has no new surgeries to report. His older sister is currently in the hospital for Covid-19 pneumonia, His 10rd oldest brother passed away from heart attack last year, otherwise he has no changes in his family medical history.   Immunizations:  His tetanus vaccine is due and is interested in receiving it during this visit.   Diet: He is not managing a specific diet at this time.   Exercise: He is not participating in regular exercise. He is planning on using his bicycle more often when the weather warms.   Colonoscopy: Last completed 11/06/2020. Results showed three 6 to 9 mm polyps in the transverse colon and the ascending colon, one 30 mm polyp in the splenic flexure, there were 3 other small (subCM) adenomatous appearing polyps in the left colon which were not removed due to the length of the procedure, internal hemorrhoids, otherwise results are normal. Repeat in 3-6 months. Due.   Vision: He is due for vision care.   Past Medical History:  Diagnosis Date   Arthritis    RA and Osteo   BPH (benign prostatic hyperplasia) 10/12/2012   History of hepatitis C    Hypertension     Past Surgical History:  Procedure Laterality Date   COLONOSCOPY     no prior surgery     POLYPECTOMY      Family History  Problem Relation Age of Onset   Diabetes Mother    Emphysema Father    Hypertension Sister    Heart failure Sister        Defibrilltor   Coronary artery disease Brother        multiple stents, 1/2 brother   Hypertension Maternal Uncle    Heart disease Other 46  died in his sleep, ?presumed heart attack   Colon cancer Neg Hx    Esophageal cancer Neg Hx    Rectal cancer Neg Hx    Stomach cancer Neg Hx    Colon polyps Neg Hx     Social History   Socioeconomic History   Marital status: Married    Spouse name: Not on file   Number of children: Not on file   Years of education: Not on file   Highest education level: Not on file  Occupational History   Not on file  Tobacco Use   Smoking status: Former    Packs/day: 0.50    Years: 40.00    Total pack years: 20.00    Types: Cigarettes    Quit date: 02/07/2020    Years since quitting: 2.6   Smokeless tobacco: Never  Vaping Use   Vaping Use: Former   Substance and Sexual Activity   Alcohol use: Not Currently    Comment: former heavy alcohol user, 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  Other Topics Concern   Not on file  Social History Narrative   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 90 tablet 1   meloxicam (MOBIC) 7.5 MG tablet Take 1 tablet by mouth once daily 90 tablet 0   Multiple Vitamin (MULTI-VITAMIN PO) Take by mouth.     tamsulosin (FLOMAX) 0.4 MG CAPS capsule Take 1 capsule by mouth once daily 90 capsule 0   Facility-Administered Medications Prior to Visit  Medication Dose Route Frequency Provider Last Rate Last Admin   0.9 %  sodium chloride infusion  500 mL Intravenous Continuous Milus Banister, MD        No Known Allergies  Review of Systems  Constitutional:  Negative for fever.       (-)weight change (-)adenopathy  HENT:  Negative for sinus pain and sore throat.   Eyes:        (-)visual disturbance  Respiratory:  Negative for cough and shortness of breath.   Cardiovascular:  Negative for chest pain, palpitations and leg swelling.  Gastrointestinal:  Negative for blood in stool, diarrhea, nausea and vomiting.  Genitourinary:  Negative for dysuria, frequency and hematuria.  Musculoskeletal:        (-)new muscle pain (-)new joint pain  Neurological:  Negative for headaches.  Psychiatric/Behavioral:  Negative for depression. The patient is not nervous/anxious.        Objective:     Physical Exam Constitutional:      General: He is not in acute distress.    Appearance: Normal appearance.  HENT:     Head: Normocephalic and atraumatic.     Right Ear: Tympanic membrane, ear canal and external ear normal.     Left Ear: Tympanic membrane, ear canal and external ear normal.  Eyes:     Extraocular Movements: Extraocular movements intact.  Right eye: No nystagmus.     Left eye: No nystagmus.     Pupils: Pupils are equal, round, and reactive to light.  Cardiovascular:     Rate and Rhythm: Normal rate and regular rhythm.     Heart sounds: Normal heart sounds. No murmur heard.    No gallop.     Comments: Blood pressure measured during 138/75 manual recheck Pulmonary:     Effort: Pulmonary effort is normal. No respiratory distress.     Breath sounds: Normal breath sounds. No wheezing or rales.  Abdominal:     General: Bowel sounds are normal. There is no distension.     Palpations: Abdomen is soft.     Tenderness: There is no abdominal tenderness. There is no guarding.  Musculoskeletal:     Comments: 5/5 strength in both upper and lower extremities  Lymphadenopathy:     Cervical: No cervical adenopathy.  Skin:    General: Skin is warm.     Comments: Small skin keratosis right temple   Neurological:     Mental Status: He is alert and oriented to person, place, and time.     Deep Tendon Reflexes:     Reflex Scores:      Patellar reflexes are 2+ on the right side and 2+ on the left side. Psychiatric:        Judgment: Judgment normal.     BP 138/85   Pulse (!) 58   Temp 98.2 F (36.8 C)   Resp 18   Ht 5' 11"$  (1.803 m)   Wt 210 lb (95.3 kg)   SpO2 95%   BMI 29.29 kg/m  Wt Readings from Last 3 Encounters:  10/17/22 210 lb (95.3 kg)  04/11/22 211 lb (95.7 kg)  05/15/21 213 lb (96.6 kg)       Assessment & Plan:  Essential hypertension Assessment & Plan: BP Readings from Last 3 Encounters:  10/17/22 (!) 144/70  04/11/22 136/61  05/15/21 122/72      Orders: -     Comprehensive metabolic panel -     Lipid panel  Benign prostatic hyperplasia without lower urinary tract symptoms Assessment & Plan: Stable on flomax.     Rheumatoid arthritis, involving unspecified site, unspecified whether rheumatoid factor present [facility]) Assessment & Plan: Continues meloxicam. Declines rheumatology follow up>   Preventative health care Assessment & Plan: Td today.   Wt Readings from Last 3 Encounters:  10/17/22 210 lb (95.3 kg)  04/11/22 211 lb (95.7 kg)  05/15/21 213 lb (96.6 kg)   Discussed increasing exercise. Diet needs improvement. Discussed specifically limiting junk food/sodium.  Td today. Colo is overdue. Will   Orders: -     Lipid panel  Polyp of colon, unspecified part of colon, unspecified type -     Ambulatory referral to Gastroenterology  Hyperglycemia -     Hemoglobin A1c  Screening for prostate cancer -     PSA  Other orders -     Td vaccine greater than or equal to 7yo preservative free IM    I, Nance Pear, NP, personally preformed the services described in this documentation.  All medical record entries made by the scribe were at my direction and in my presence.  I have reviewed the chart and discharge instructions (if applicable) and agree that the record reflects my personal performance and is accurate and complete. 10/17/2022   I,Shehryar Baig,acting as a scribe for Nance Pear, NP.,have documented all relevant documentation on the behalf  of Nance Pear, NP,as directed by  Nance Pear, NP while in the presence of Nance Pear, NP.   Nance Pear, NP

## 2022-10-17 NOTE — Assessment & Plan Note (Addendum)
Continues meloxicam. Declines rheumatology follow up>

## 2022-10-26 ENCOUNTER — Other Ambulatory Visit: Payer: Self-pay | Admitting: Family

## 2022-12-03 ENCOUNTER — Encounter: Payer: Self-pay | Admitting: Family

## 2022-12-03 MED ORDER — AMLODIPINE BESYLATE 5 MG PO TABS: 5.0000 mg | ORAL_TABLET | Freq: Every day | ORAL | 0 refills | Status: DC

## 2022-12-16 ENCOUNTER — Encounter: Payer: Self-pay | Admitting: Gastroenterology

## 2022-12-24 ENCOUNTER — Ambulatory Visit (INDEPENDENT_AMBULATORY_CARE_PROVIDER_SITE_OTHER): Payer: Commercial Managed Care - PPO | Admitting: Orthopedic Surgery

## 2022-12-24 ENCOUNTER — Other Ambulatory Visit (INDEPENDENT_AMBULATORY_CARE_PROVIDER_SITE_OTHER): Payer: Commercial Managed Care - PPO

## 2022-12-24 ENCOUNTER — Other Ambulatory Visit: Payer: Self-pay | Admitting: Family

## 2022-12-24 DIAGNOSIS — M25561 Pain in right knee: Secondary | ICD-10-CM | POA: Diagnosis not present

## 2022-12-25 ENCOUNTER — Ambulatory Visit (AMBULATORY_SURGERY_CENTER): Payer: Commercial Managed Care - PPO

## 2022-12-25 VITALS — Ht 71.0 in | Wt 200.0 lb

## 2022-12-25 DIAGNOSIS — Z86010 Personal history of colonic polyps: Secondary | ICD-10-CM

## 2022-12-25 MED ORDER — NA SULFATE-K SULFATE-MG SULF 17.5-3.13-1.6 GM/177ML PO SOLN
1.0000 | Freq: Once | ORAL | 0 refills | Status: AC
Start: 2022-12-25 — End: 2022-12-25

## 2022-12-25 NOTE — Progress Notes (Signed)
No egg or soy allergy known to patient  No issues known to pt with past sedation with any surgeries or procedures Patient denies ever being told they had issues or difficulty with intubation  No FH of Malignant Hyperthermia Pt is not on diet pills Pt is not on  home 02  Pt is not on blood thinners  Pt denies issues with constipation  No A fib or A flutter Have any cardiac testing pending--no Pt instructed to use [email or web address] or GoodRx for a price reduction on prep   

## 2022-12-26 ENCOUNTER — Encounter: Payer: Self-pay | Admitting: Orthopedic Surgery

## 2022-12-26 NOTE — Progress Notes (Signed)
Office Visit Note   Patient: William Neal           Date of Birth: [birth year]           MRN: [id number] Visit Date: 12/24/2022 Requested by: Sandford Craze, NP [id number] [place],  [place] [address] PCP: Sandford Craze, NP  Subjective: Chief Complaint  Patient presents with   Knee Pain    HPI: William Neal is a 67 y.o. male who presents to the office reporting [name] is a 67 year old patient with right greater than left knee pain.  Injured his right knee 2 weeks ago.  Felt a pop in the medial aspect of the knee.  He is wearing a brace.  He is wearing a brace.  He is concerned that he has fluid in the knee.  Feeling a little bit better now.  He retires the first week in December.  He has had left knee pain in the past which was treated successfully and resolved.  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                ROS: All systems reviewed are negative as they relate to the chief complaint within the history of present illness.  Patient denies fevers or chills.  Assessment & Plan: Visit Diagnoses:  1. Right knee pain, unspecified chronicity     Plan: Impression is mild effusion in the right knee with possible meniscal pathology.  Currently he is feeling better.  He is using a brace.  If his symptoms worsen I think an injection could be helpful.  No indication with improving symptoms for MRI scanning.  I think going up and down ladders is a big stress on both knees.  He will follow-up as needed.  Follow-Up Instructions: No follow-ups on file.   Orders:  Orders Placed This Encounter  Procedures   XR KNEE 3 VIEW RIGHT   No orders of the defined types were placed in this encounter.     Procedures: No procedures performed   Clinical Data: No additional findings.  Objective: Vital Signs: There were no vitals taken for this visit.  Physical Exam:  Constitutional: Patient appears well-developed HEENT:   Head: Normocephalic Eyes:EOM are normal Neck: Normal range of motion Cardiovascular: Normal rate Pulmonary/chest: Effort normal Neurologic: Patient is alert Skin: Skin is warm Psychiatric: Patient has normal mood and affect  Ortho Exam: Ortho exam demonstrates full range of motion of both knees.  Mild medial joint space tenderness bilaterally.  Trace effusion in the left knee mild effusion in the right knee.  On the right-hand side collateral and cruciate ligaments are stable.  Range of motion and extensor mechanism intact.  Pedal pulses palpable.  Specialty Comments:  No specialty comments available.  Imaging: No results found.   PMFS History: Patient Active Problem List   Diagnosis Date Noted   Essential hypertension 05/14/2020   Rheumatoid arthritis (HCC) 05/14/2020   Other malaise and fatigue 04/24/2014   Skin cyst 04/24/2014   Preventative health care 10/12/2012   History of hepatitis C 10/12/2012   History of colon polyps 10/12/2012   BPH (benign prostatic hyperplasia) 10/12/2012   Past Medical History:  Diagnosis Date   Arthritis    RA and Osteo   BPH (benign prostatic hyperplasia) 10/12/2012   History of hepatitis C    Hypertension     Family History  Problem Relation Age of Onset   Diabetes Mother  Emphysema Father    Hypertension Sister    Heart failure Sister        Defibrilltor   Coronary artery disease Brother        multiple stents, 1/2 brother   Hypertension Maternal Uncle    Heart disease Other 80       died in his sleep, ?presumed heart attack   Colon cancer Neg Hx    Esophageal cancer Neg Hx    Rectal cancer Neg Hx    Stomach cancer Neg Hx    Colon polyps Neg Hx     Past Surgical History:  Procedure Laterality Date   COLONOSCOPY     no prior surgery     POLYPECTOMY     Social History   Occupational History   Not on file  Tobacco Use   Smoking status: Former    Packs/day: 0.50    Years: 40.00    Additional pack years: 0.00     Total pack years: 20.00    Types: Cigarettes    Quit date: 02/07/2020    Years since quitting: 2.8   Smokeless tobacco: Never  Vaping Use   Vaping Use: Former  Substance and Sexual Activity   Alcohol use: Not Currently    Comment: former heavy alcohol user, 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

## 2022-12-30 ENCOUNTER — Encounter: Payer: Self-pay | Admitting: Gastroenterology

## 2023-01-06 ENCOUNTER — Ambulatory Visit (AMBULATORY_SURGERY_CENTER): Payer: Commercial Managed Care - PPO | Admitting: Gastroenterology

## 2023-01-06 ENCOUNTER — Encounter: Payer: Self-pay | Admitting: Gastroenterology

## 2023-01-06 VITALS — BP 108/68 | HR 50 | Temp 98.0°F | Resp 13 | Ht 71.0 in | Wt 205.0 lb

## 2023-01-06 DIAGNOSIS — D125 Benign neoplasm of sigmoid colon: Secondary | ICD-10-CM

## 2023-01-06 DIAGNOSIS — D123 Benign neoplasm of transverse colon: Secondary | ICD-10-CM | POA: Diagnosis not present

## 2023-01-06 DIAGNOSIS — Z09 Encounter for follow-up examination after completed treatment for conditions other than malignant neoplasm: Secondary | ICD-10-CM | POA: Diagnosis present

## 2023-01-06 DIAGNOSIS — Z86010 Personal history of colonic polyps: Secondary | ICD-10-CM

## 2023-01-06 DIAGNOSIS — K635 Polyp of colon: Secondary | ICD-10-CM | POA: Diagnosis not present

## 2023-01-06 MED ORDER — SODIUM CHLORIDE 0.9 % IV SOLN
500.0000 mL | Freq: Once | INTRAVENOUS | Status: DC
Start: 2023-01-06 — End: 2023-01-06

## 2023-01-06 NOTE — Progress Notes (Signed)
[facility] History and Physical   Primary Care Physician:  Sandford Craze, NP   Reason for Procedure:   Polyp surveillance   Plan:    Colonoscopy     HPI: William Neal is a 67 y.o. male undergoing repeat colonoscopy.  He last underwent colonoscopy in March 2022 and a 30 mm tubular adenoma was removed piecemeal in the splenic flexure as well as  few other smaller tubular adenomas.  Some polyps in the left colon were not resected.  He has a history of multiple polyps on his index colonoscopy in 2014 including a tubulovillous adenoma.  He has no family history of colon cancer and no chronic GI symptoms.    Past Medical History:  Diagnosis Date   Arthritis    RA and Osteo   BPH (benign prostatic hyperplasia) 10/12/2012   History of hepatitis C    Hypertension     Past Surgical History:  Procedure Laterality Date   COLONOSCOPY     no prior surgery     POLYPECTOMY      Prior to Admission medications   Medication Sig Start Date End Date Taking? Authorizing Provider  amLODipine (NORVASC) 5 MG tablet Take 1 tablet (5 mg total) by mouth daily. 12/03/22  Yes Sandford Craze, NP  Ibuprofen (ADVIL PO) Take by mouth as needed.    [provider]  meloxicam (MOBIC) 7.5 MG tablet Take 1 tablet by mouth once daily 12/24/22   Sandford Craze, NP  Multiple Vitamin (MULTI-VITAMIN PO) Take by mouth. Patient not taking: Reported on 12/25/2022    [provider]  tamsulosin (FLOMAX) 0.4 MG CAPS capsule Take 1 capsule by mouth once daily Patient not taking: Reported on 12/25/2022 09/29/22   Sandford Craze, NP    Current Outpatient Medications  Medication Sig Dispense Refill   amLODipine (NORVASC) 5 MG tablet Take 1 tablet (5 mg total) by mouth daily. 90 tablet 0   Ibuprofen (ADVIL PO) Take by mouth as needed.     meloxicam (MOBIC) 7.5 MG tablet Take 1 tablet by mouth once daily 90 tablet 0   Multiple Vitamin (MULTI-VITAMIN PO) Take by mouth. (Patient  not taking: Reported on 12/25/2022)     tamsulosin (FLOMAX) 0.4 MG CAPS capsule Take 1 capsule by mouth once daily (Patient not taking: Reported on 12/25/2022) 90 capsule 0   Current Facility-Administered Medications  Medication Dose Route Frequency Provider Last Rate Last Admin   0.9 %  sodium chloride infusion  500 mL Intravenous Once Jenel Lucks, MD        Allergies as of 01/06/2023   (No Known Allergies)    Family History  Problem Relation Age of Onset   Diabetes Mother    Emphysema Father    Hypertension Sister    Heart failure Sister        Defibrilltor   Coronary artery disease Brother        multiple stents, 1/2 brother   Hypertension Maternal Uncle    Heart disease Other 80       died in his sleep, ?presumed heart attack   Colon cancer Neg Hx    Esophageal cancer Neg Hx    Rectal cancer Neg Hx    Stomach cancer Neg Hx    Colon polyps Neg Hx     Social History   Socioeconomic History   Marital status: Married    Spouse name: Not on file   Number of children: Not on file   Years of education: Not  on file   Highest education level: Not on file  Occupational History   Not on file  Tobacco Use   Smoking status: Some Days    Packs/day: 0.50    Years: 40.00    Additional pack years: 0.00    Total pack years: 20.00    Types: Cigarettes    Last attempt to quit: 02/07/2020    Years since quitting: 2.9   Smokeless tobacco: Never  Vaping Use   Vaping Use: Former  Substance and Sexual Activity   Alcohol use: Not Currently    Comment: former heavy alcohol user, 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, recycle program   Married   2 step children   Enjoys fishing   Completed college   Social Determinants of Corporate investment banker Strain: Not on file  Food Insecurity: Not on file  Transportation Needs: Not on file  Physical  Activity: Not on file  Stress: Not on file  Social Connections: Not on file  Intimate Partner Violence: Not on file    Review of Systems:  All other review of systems negative except as mentioned in the HPI.  Physical Exam: Vital signs BP 127/76   Pulse 61   Temp 98 F (36.7 C) (Skin)   Resp (!) 23   Ht 5\' 11"  (1.803 m)   Wt 205 lb (93 kg)   SpO2 94%   BMI 28.59 kg/m   General:   Alert,  Well-developed, well-nourished, pleasant and cooperative in NAD Airway:  Mallampati 1 Lungs:  Clear throughout to auscultation.   Heart:  Regular rate and rhythm; no murmurs, clicks, rubs,  or gallops. Abdomen:  Soft, nontender and nondistended. Normal bowel sounds.   Neuro/Psych:  Normal mood and affect. A and O x 3   Liylah Najarro E. Tomasa Rand, MD [facility]

## 2023-01-06 NOTE — Progress Notes (Signed)
Uneventful anesthetic. Report to pacu rn. Vss. Care resumed by rn. 

## 2023-01-06 NOTE — Patient Instructions (Signed)
Handout on polyps given to patient, await pathology results Resume previous diet and continue present medications Repeat colonoscopy in 9 months for surveillance of the piecemeal polypectomy    YOU HAD AN ENDOSCOPIC PROCEDURE TODAY AT THE [facility]:   Refer to the procedure report that was given to you for any specific questions about what was found during the examination.  If the procedure report does not answer your questions, please call your gastroenterologist to clarify.  If you requested that your care partner not be given the details of your procedure findings, then the procedure report has been included in a sealed envelope for you to review at your convenience later.  YOU SHOULD EXPECT: Some feelings of bloating in the abdomen. Passage of more gas than usual.  Walking can help get rid of the air that was put into your GI tract during the procedure and reduce the bloating. If you had a lower endoscopy (such as a colonoscopy or flexible sigmoidoscopy) you may notice spotting of blood in your stool or on the toilet paper. If you underwent a bowel prep for your procedure, you may not have a normal bowel movement for a few days.  Please Note:  You might notice some irritation and congestion in your nose or some drainage.  This is from the oxygen used during your procedure.  There is no need for concern and it should clear up in a day or so.  SYMPTOMS TO REPORT IMMEDIATELY:  Following lower endoscopy (colonoscopy or flexible sigmoidoscopy):  Excessive amounts of blood in the stool  Significant tenderness or worsening of abdominal pains  Swelling of the abdomen that is new, acute  Fever of 100F or higher  For urgent or emergent issues, a gastroenterologist can be reached at any hour by calling [phone]. Do not use MyChart messaging for urgent concerns.    DIET:  We do recommend a small meal at first, but then you may proceed to your regular diet.  Drink plenty of fluids  but you should avoid alcoholic beverages for 24 hours.  ACTIVITY:  You should plan to take it easy for the rest of today and you should NOT DRIVE or use heavy machinery until tomorrow (because of the sedation medicines used during the test).    FOLLOW UP: Our staff will call the number listed on your records the next business day following your procedure.  We will call around 7:15- 8:00 am to check on you and address any questions or concerns that you may have regarding the information given to you following your procedure. If we do not reach you, we will leave a message.     If any biopsies were taken you will be contacted by phone or by letter within the next 1-3 weeks.  Please call us at [phone] if you have not heard about the biopsies in 3 weeks.    SIGNATURES/CONFIDENTIALITY: You and/or your care partner have signed paperwork which will be entered into your electronic medical record.  These signatures attest to the fact that that the information above on your After Visit Summary has been reviewed and is understood.  Full responsibility of the confidentiality of this discharge information lies with you and/or your care-partner.

## 2023-01-06 NOTE — Progress Notes (Signed)
Pt's states no medical or surgical changes since previsit or office visit. 

## 2023-01-06 NOTE — Progress Notes (Signed)
Called to room to assist during endoscopic procedure.  Patient ID and intended procedure confirmed with present staff. Received instructions for my participation in the procedure from the performing physician.  

## 2023-01-06 NOTE — Op Note (Signed)
[facility] Patient Name: William Neal Procedure Date: 01/06/2023 10:26 AM MRN: [id number] Endoscopist: Lorin Picket E. Tomasa Rand , MD, [id number] Age: 67 Referring MD:  Date of Birth: [birth year] Gender: Male Account #: [id number] Procedure:                Colonoscopy Indications:              Surveillance: Piecemeal removal of large sessile                            adenoma last colonoscopy (< 3 yrs). 30 mm TA                            removed in splenic flexure, removed piecemeal in                            Mar 2022; recommended repeat colonoscopy in 6 months Medicines:                Monitored Anesthesia Care Procedure:                Pre-Anesthesia Assessment:                           - Prior to the procedure, a History and Physical                            was performed, and patient medications and                            allergies were reviewed. The patient's tolerance of                            previous anesthesia was also reviewed. The risks                            and benefits of the procedure and the sedation                            options and risks were discussed with the patient.                            All questions were answered, and informed consent                            was obtained. Prior Anticoagulants: The patient has                            taken no anticoagulant or antiplatelet agents. ASA                            Grade Assessment: II - A patient with mild systemic                            disease. After reviewing the risks and benefits,  the patient was deemed in satisfactory condition to                            undergo the procedure.                           After obtaining informed consent, the colonoscope                            was passed under direct vision. Throughout the                            procedure, the patient's blood pressure, pulse, and                            oxygen  saturations were monitored continuously. The                            CF [id number] #[id number] was introduced through the anus                            and advanced to the the cecum, identified by                            appendiceal orifice and ileocecal valve. The                            colonoscopy was performed without difficulty. The                            patient tolerated the procedure well. The quality                            of the bowel preparation was good. The ileocecal                            valve, appendiceal orifice, and rectum were                            photographed. The bowel preparation used was SUPREP                            via split dose instruction. Scope In: 10:55:06 AM Scope Out: 11:21:46 AM Scope Withdrawal Time: 0 hours 23 minutes 39 seconds  Total Procedure Duration: 0 hours 26 minutes 40 seconds  Findings:                 The perianal and digital rectal examinations were                            normal. Pertinent negatives include normal                            sphincter tone and no palpable rectal lesions.  A polyp was found in the splenic flexure. The polyp                            was sessile. The polyp was associated with scarring                            and there actually appeared to be 2 separate polyps                            in close proximity with an aggregate size of about                            2cm. A tattoo was seen adjacent to the polyp.                            Piecemeal polypectomy was performed using a cold                            snare. There appeared to be a small amount of                            residual polypoid mucosa in the center of the polyp                            which was removed with forceps. Resection and                            retrieval were complete.                           A 4 mm polyp was found in the sigmoid colon. The                             polyp was sessile. The polyp was removed with a                            cold snare. Resection and retrieval were complete.                            Estimated blood loss was minimal.                           The exam was otherwise normal throughout the                            examined colon.                           The retroflexed view of the distal rectum and anal                            verge was normal and showed no anal or rectal  abnormalities. Complications:            No immediate complications. Estimated Blood Loss:     Estimated blood loss was minimal. Impression:               - One 20 mm polyp at the splenic flexure,                            consistent with the patient's 30 mm polyp removed                            piecemeal in 2022, removed with a combination of                            piecemeal cold snare and cold biopsy forceps.                            Resected and retrieved.                           - One 4 mm polyp in the sigmoid colon, removed with                            a cold snare. Resected and retrieved.                           - The distal rectum and anal verge are normal on                            retroflexion view. Recommendation:           - Patient has a contact number available for                            emergencies. The signs and symptoms of potential                            delayed complications were discussed with the                            patient. Return to normal activities tomorrow.                            Written discharge instructions were provided to the                            patient.                           - Resume previous diet.                           - Continue present medications.                           - Await pathology results.                           -  Repeat colonoscopy in 9 months for surveillance                            after piecemeal  polypectomy. Antanisha Mohs E. Tomasa Rand, MD 01/06/2023 11:32:23 AM This report has been signed electronically.

## 2023-01-07 ENCOUNTER — Telehealth: Payer: Self-pay

## 2023-01-07 NOTE — Telephone Encounter (Signed)
No answer, left message to call if having any issues or concerns, B.Robertha Staples RN 

## 2023-01-12 NOTE — Progress Notes (Signed)
The large polyp which I removed during your recent procedure was proven to be completely benign but is considered a "pre-cancerous" polyp that MAY have grown into cancer if it had not been removed.  Based on current nationally recognized surveillance guidelines following a piecemeal polypectomy of a large polyp, I recommend that you have a repeat colonoscopy in 9 months, as discussed.   If you develop any new rectal bleeding, abdominal pain or significant bowel habit changes, please contact me before then.

## 2023-05-16 ENCOUNTER — Other Ambulatory Visit: Payer: Self-pay | Admitting: Family

## 2023-05-17 ENCOUNTER — Telehealth: Payer: Self-pay | Admitting: Family

## 2023-05-17 NOTE — Telephone Encounter (Signed)
Please contact pt to schedule a follow up visit.  

## 2023-05-20 NOTE — Telephone Encounter (Signed)
LVM 2 sched.

## 2023-06-06 ENCOUNTER — Other Ambulatory Visit: Payer: Self-pay | Admitting: Family

## 2023-07-29 ENCOUNTER — Ambulatory Visit: Payer: Commercial Managed Care - PPO | Admitting: Orthopedic Surgery

## 2023-08-15 ENCOUNTER — Other Ambulatory Visit: Payer: Self-pay | Admitting: Family

## 2023-08-15 NOTE — Telephone Encounter (Signed)
Please contact pt to schedule office visit. 

## 2023-08-17 NOTE — Telephone Encounter (Signed)
Lvm2 sched  

## 2023-08-21 ENCOUNTER — Ambulatory Visit: Payer: Commercial Managed Care - PPO | Admitting: Orthopedic Surgery

## 2023-09-06 ENCOUNTER — Other Ambulatory Visit: Payer: Self-pay | Admitting: Family

## 2023-09-06 NOTE — Telephone Encounter (Signed)
 Please contact pt to schedule a follow up appointment.

## 2023-09-07 NOTE — Telephone Encounter (Signed)
 Lvm to sch

## 2023-09-12 ENCOUNTER — Other Ambulatory Visit: Payer: Self-pay | Admitting: Family

## 2023-10-06 ENCOUNTER — Other Ambulatory Visit: Payer: Self-pay | Admitting: Family

## 2023-10-10 ENCOUNTER — Other Ambulatory Visit: Payer: Self-pay | Admitting: Family

## 2023-10-10 NOTE — Telephone Encounter (Signed)
Please call pt to schedule follow up visit. 

## 2023-10-12 NOTE — Telephone Encounter (Signed)
 Lvm 2 sch.

## 2023-11-05 ENCOUNTER — Telehealth: Payer: Self-pay | Admitting: Family

## 2023-11-05 ENCOUNTER — Other Ambulatory Visit: Payer: Self-pay | Admitting: Family

## 2023-11-05 NOTE — Telephone Encounter (Signed)
 Please advise pt that he has not been seen in >1 year, will need OV prior to additional refills.

## 2023-11-05 NOTE — Telephone Encounter (Signed)
 Lvm 2 sch.

## 2023-11-11 ENCOUNTER — Other Ambulatory Visit: Payer: Self-pay | Admitting: Family

## 2023-12-04 ENCOUNTER — Ambulatory Visit (INDEPENDENT_AMBULATORY_CARE_PROVIDER_SITE_OTHER): Admitting: Family

## 2023-12-04 ENCOUNTER — Encounter: Payer: Self-pay | Admitting: Family

## 2023-12-04 VITALS — BP 150/80 | HR 54 | Temp 98.4°F | Resp 16 | Ht 71.0 in | Wt 205.0 lb

## 2023-12-04 DIAGNOSIS — I10 Essential (primary) hypertension: Secondary | ICD-10-CM | POA: Diagnosis not present

## 2023-12-04 DIAGNOSIS — M069 Rheumatoid arthritis, unspecified: Secondary | ICD-10-CM | POA: Diagnosis not present

## 2023-12-04 DIAGNOSIS — Z0000 Encounter for general adult medical examination without abnormal findings: Secondary | ICD-10-CM

## 2023-12-04 DIAGNOSIS — Z0001 Encounter for general adult medical examination with abnormal findings: Secondary | ICD-10-CM | POA: Diagnosis not present

## 2023-12-04 DIAGNOSIS — R739 Hyperglycemia, unspecified: Secondary | ICD-10-CM | POA: Diagnosis not present

## 2023-12-04 DIAGNOSIS — N400 Enlarged prostate without lower urinary tract symptoms: Secondary | ICD-10-CM

## 2023-12-04 DIAGNOSIS — Z1211 Encounter for screening for malignant neoplasm of colon: Secondary | ICD-10-CM

## 2023-12-04 DIAGNOSIS — E781 Pure hyperglyceridemia: Secondary | ICD-10-CM

## 2023-12-04 DIAGNOSIS — Z125 Encounter for screening for malignant neoplasm of prostate: Secondary | ICD-10-CM | POA: Diagnosis not present

## 2023-12-04 LAB — COMPREHENSIVE METABOLIC PANEL WITH GFR
ALT: 22 U/L (ref 0–53)
AST: 18 U/L (ref 0–37)
Albumin: 4.7 g/dL (ref 3.5–5.2)
Alkaline Phosphatase: 101 U/L (ref 39–117)
BUN: 11 mg/dL (ref 6–23)
CO2: 23 meq/L (ref 19–32)
Calcium: 9.5 mg/dL (ref 8.4–10.5)
Chloride: 105 meq/L (ref 96–112)
Creatinine, Ser: 0.89 mg/dL (ref 0.40–1.50)
GFR: 90.03 mL/min (ref >60.00)
Glucose, Bld: 107 mg/dL — ABNORMAL HIGH (ref 70–99)
Potassium: 3.6 meq/L (ref 3.5–5.1)
Sodium: 140 meq/L (ref 135–145)
Total Bilirubin: 0.6 mg/dL (ref 0.2–1.2)
Total Protein: 6.8 g/dL (ref 6.0–8.3)

## 2023-12-04 LAB — LIPID PANEL
Cholesterol: 189 mg/dL (ref 0–200)
HDL: 29.1 mg/dL — ABNORMAL LOW (ref >39.00)
LDL Cholesterol: 114 mg/dL — ABNORMAL HIGH (ref 0–99)
NonHDL: 159.53
Total CHOL/HDL Ratio: 6
Triglycerides: 229 mg/dL — ABNORMAL HIGH (ref 0.0–149.0)
VLDL: 45.8 mg/dL — ABNORMAL HIGH (ref 0.0–40.0)

## 2023-12-04 LAB — HEMOGLOBIN A1C: Hgb A1c MFr Bld: 5.8 % (ref 4.6–6.5)

## 2023-12-04 LAB — PSA, MEDICARE: PSA: 0.72 ng/mL (ref 0.10–4.00)

## 2023-12-04 MED ORDER — MELOXICAM 7.5 MG PO TABS: 7.5000 mg | ORAL_TABLET | Freq: Every day | ORAL | 1 refills | Status: DC

## 2023-12-04 MED ORDER — AMLODIPINE BESYLATE 5 MG PO TABS: 7.5000 mg | ORAL_TABLET | Freq: Every day | ORAL | 1 refills | Status: DC

## 2023-12-04 NOTE — Assessment & Plan Note (Signed)
 Discussed healthy diet, exercise.  Due for follow up colonoscopy. Declines shingrix, prevnar, lung cancer screening.

## 2023-12-04 NOTE — Assessment & Plan Note (Signed)
 Uncontrolled, will increase from 5mg  amlodipine to 7.5mg  daily.

## 2023-12-04 NOTE — Assessment & Plan Note (Signed)
 Not using flomax, voiding without difficulty.

## 2023-12-04 NOTE — Assessment & Plan Note (Signed)
 Declines rheumatology- continues meloxicam.

## 2023-12-04 NOTE — Progress Notes (Signed)
 Subjective:     Patient ID: William Neal, male    DOB: [birth year], 68 y.o.   MRN: [id number]  Chief Complaint  Patient presents with   Annual Exam    HPI  Discussed the use of AI scribe software for clinical note transcription with the patient, who gave verbal consent to proceed.  History of Present Illness William Neal is a 68 year old male with hypertension who presents for an update on his physical exam and concerns about elevated blood pressure.  He has experienced elevated blood pressure since retiring, with recent readings higher than usual. His wife noticed facial redness, prompting a blood pressure check that confirmed the elevation. He takes 7.5 mg of amlodipine daily, usually at night, and is considering switching to morning dosing. He has experienced some leg swelling after prolonged standing, which he attributes to arthritis and possibly amlodipine use.  He has not taken Flomax recently and reports no significant urinary issues, though there is a slight increase in urgency. He continues to take meloxicam daily for rheumatoid arthritis, which affects his wrists and knees. Since retiring and reducing physical strain, he feels the pain is less severe. He has not engaged with rheumatology due to adverse reactions to medications in the past.  He experiences migraines once or twice a month, often triggered by weather changes. He manages them with Advil, as previous use of Imitrex caused discomfort followed by euphoria, which he found undesirable.  He has a history of a large polyp removal and is due for a follow-up colonoscopy. He is cautious about potential costs due to being on a fixed income.  He uses a nasal irrigation device to manage sinus issues, which has reduced his frequency of sinus infections.  He quit smoking over a year ago, and his wife, who used to smoke, now vapes occasionally.  He is considering dental work due to poor dental health and is up to date with his  vision care. He has reduced his intake of sweets recently, acknowledging the need for a healthier diet.  Immunizations: declines shingrix and pneumovax Diet: needs improvement Exercise: just joined the [employer] sneakers Colonoscopy: due Vision: due Dental: due Lab Results  Component Value Date   PSA 0.82 10/17/2022   PSA 0.45 05/14/2020   PSA 0.50 03/27/2017  Declines CT lung cancer screening       Health Maintenance Due  Topic Date Due   Medicare Annual Wellness (AWV)  Never done   Colonoscopy  01/06/2024    Past Medical History:  Diagnosis Date   Arthritis    RA and Osteo   BPH (benign prostatic hyperplasia) 10/12/2012   History of hepatitis C    Hypertension     Past Surgical History:  Procedure Laterality Date   COLONOSCOPY     no prior surgery     POLYPECTOMY      Family History  Problem Relation Age of Onset   Diabetes Mother    Emphysema Father    Hypertension Sister    Heart failure Sister        Defibrilltor   Coronary artery disease Brother        multiple stents, 1/2 brother   Hypertension Maternal Uncle    Heart disease Other 80       died in his sleep, ?presumed heart attack   Colon cancer Neg Hx    Esophageal cancer Neg Hx    Rectal cancer Neg Hx    Stomach cancer Neg Hx  Colon polyps Neg Hx     Social History   Socioeconomic History   Marital status: Married    Spouse name: Not on file   Number of children: Not on file   Years of education: Not on file   Highest education level: Not on file  Occupational History   Not on file  Tobacco Use   Smoking status: Former    Current packs/day: 0.00    Average packs/day: 0.5 packs/day for 40.0 years (20.0 ttl pk-yrs)    Types: Cigarettes    Start date: 02/07/1980    Quit date: 09/01/2022    Years since quitting: 1.2   Smokeless tobacco: Never  Vaping Use   Vaping status: Former  Substance and Sexual Activity   Alcohol use: Not Currently    Comment: former heavy alcohol user,  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, recycle program   Married   2 step children   Enjoys fishing   Completed college   Social Drivers of Health   Financial Resource Strain: Patient Declined (12/04/2023)   Overall Financial Resource Strain (CARDIA)    Difficulty of Paying Living Expenses: Patient declined  Food Insecurity: Patient Declined (12/04/2023)   Hunger Vital Sign    Worried About Running Out of Food in the Last Year: Patient declined    Ran Out of Food in the Last Year: Patient declined  Transportation Needs: No Transportation Needs (12/04/2023)   PRAPARE - Administrator, Civil Service (Medical): No    Lack of Transportation (Non-Medical): No  Physical Activity: Insufficiently Active (12/04/2023)   Exercise Vital Sign    Days of Exercise per Week: 1 day    Minutes of Exercise per Session: 30 min  Stress: No Stress Concern Present (12/04/2023)   [employer] of Occupational Health - Occupational Stress Questionnaire    Feeling of Stress : Not at all  Social Connections: Moderately Integrated (12/04/2023)   Social Connection and Isolation Panel [NHANES]    Frequency of Communication with Friends and Family: More than three times a week    Frequency of Social Gatherings with Friends and Family: Once a week    Attends Religious Services: More than 4 times per year    Active Member of [employer] or Organizations: No    Attends Engineer, structural: Not on file    Marital Status: Married  Intimate Partner Violence: Unknown (12/02/2021)   Received from [employer], [facility]   HITS    Physically Hurt: Not on file    Insult or Talk Down To: Not on file    Threaten Physical Harm: Not on file    Scream or Curse: Not on file    Outpatient Medications Prior to Visit  Medication Sig Dispense Refill   Ibuprofen (ADVIL PO) Take by mouth as  needed.     Multiple Vitamin (MULTI-VITAMIN PO) Take by mouth.     amLODipine (NORVASC) 5 MG tablet Take 1 tablet by mouth once daily 30 tablet 0   meloxicam (MOBIC) 7.5 MG tablet Take 1 tablet by mouth once daily 30 tablet 0   tamsulosin (FLOMAX) 0.4 MG CAPS capsule Take 1 capsule by mouth once daily 90 capsule 0   No facility-administered medications prior to visit.    No Known Allergies  Review of Systems  Constitutional:  Negative for weight loss.  HENT:  Negative for congestion and hearing loss.   Eyes:  Negative for blurred vision.  Respiratory:  Negative for cough.   Cardiovascular:  Positive for leg swelling (sometimes).  Gastrointestinal:  Negative for constipation and diarrhea.  Genitourinary:  Negative for dysuria and frequency.  Musculoskeletal:  Positive for joint pain. Negative for myalgias.  Skin:  Negative for rash.  Neurological:  Positive for headaches (migraines about 1-2 times a month).  Psychiatric/Behavioral:         Denies depression/anxiety       Objective:    Physical Exam   BP (!) 150/80   Pulse (!) 54   Temp 98.4 F (36.9 C) (Oral)   Resp 16   Ht 5\' 11"  (1.803 m)   Wt 205 lb (93 kg)   SpO2 96%   BMI 28.59 kg/m  Wt Readings from Last 3 Encounters:  12/04/23 205 lb (93 kg)  01/06/23 205 lb (93 kg)  12/25/22 200 lb (90.7 kg)   Physical Exam  Constitutional: He is oriented to person, place, and time. He appears well-developed and well-nourished. No distress.  HENT:  Head: Normocephalic and atraumatic.  Right Ear: Tympanic membrane and ear canal normal.  Left Ear: Tympanic membrane and ear canal normal.  Mouth/Throat: Oropharynx is clear and moist.  Eyes: Pupils are equal, round, and reactive to light. No scleral icterus.  Neck: Normal range of motion. No thyromegaly present.  Cardiovascular: Normal rate and regular rhythm.   No murmur heard. Pulmonary/Chest: Effort normal and breath sounds normal. No respiratory distress. He has no  wheezes. He has no rales. He exhibits no tenderness.  Abdominal: Soft. Bowel sounds are normal. He exhibits no distension and no mass. There is no tenderness. There is no rebound and no guarding.  Musculoskeletal: He exhibits no edema.  Lymphadenopathy:    He has no cervical adenopathy.  Neurological: He is alert and oriented to person, place, and time. He has normal patellar reflexes. He exhibits normal muscle tone. Coordination normal.  Skin: Skin is warm and dry.  Psychiatric: He has a normal mood and affect. His behavior is normal. Judgment and thought content normal.           Assessment & Plan:       Assessment & Plan:   Problem List Items Addressed This Visit       Unprioritized   Rheumatoid arthritis (HCC)   Declines rheumatology- continues meloxicam.       Relevant Medications   meloxicam (MOBIC) 7.5 MG tablet   Preventative health care   Discussed healthy diet, exercise.  Due for follow up colonoscopy. Declines shingrix, prevnar, lung cancer screening.       Essential hypertension - Primary   Uncontrolled, will increase from 5mg  amlodipine to 7.5mg  daily.       Relevant Medications   amLODipine (NORVASC) 5 MG tablet   BPH (benign prostatic hyperplasia)   Not using flomax, voiding without difficulty.      Other Visit Diagnoses       Screening for colon cancer       Relevant Orders   Ambulatory referral to Gastroenterology     Screening for prostate cancer       Relevant Orders   PSA, Medicare ( [place] only)     Hyperglycemia       Relevant Orders   Comp Met (CMET)   HgB A1c     Hypertriglyceridemia       Relevant  Medications   amLODipine (NORVASC) 5 MG tablet   Other Relevant Orders   Lipid panel       I have discontinued Jerryl Lish's tamsulosin. I have also changed his amLODipine and meloxicam. Additionally, I am having him maintain his Multiple Vitamin (MULTI-VITAMIN PO) and Ibuprofen (ADVIL PO).  Meds ordered this encounter   Medications   amLODipine (NORVASC) 5 MG tablet    Sig: Take 1.5 tablets (7.5 mg total) by mouth daily.    Dispense:  135 tablet    Refill:  1    Supervising Provider:   Danise Edge A [[id number]]   meloxicam (MOBIC) 7.5 MG tablet    Sig: Take 1 tablet (7.5 mg total) by mouth daily.    Dispense:  90 tablet    Refill:  1    Supervising Provider:   Danise Edge A [[id number]]

## 2023-12-05 ENCOUNTER — Encounter: Payer: Self-pay | Admitting: Family

## 2024-01-26 ENCOUNTER — Telehealth: Payer: Self-pay

## 2024-01-26 NOTE — Telephone Encounter (Signed)
 Copied from CRM [id number]. Topic: Clinical - Prescription Issue >> Jan 26, 2024  9:18 AM Crispin Dolphin wrote: Reason for CRM: Patient called wanted added new pharmacy on mychart but old one is still showing for medication. Did not want to delete list of medications. Would like any new Rx or refills to be sent to [id number] - [place], [place] - [address] Due to distance. Thank You

## 2024-01-26 NOTE — Telephone Encounter (Signed)
 Pharmacy already updated.

## 2024-05-27 ENCOUNTER — Other Ambulatory Visit: Payer: Self-pay | Admitting: Family

## 2024-05-27 DIAGNOSIS — I10 Essential (primary) hypertension: Secondary | ICD-10-CM

## 2024-05-27 DIAGNOSIS — M069 Rheumatoid arthritis, unspecified: Secondary | ICD-10-CM

## 2024-05-30 NOTE — Telephone Encounter (Signed)
Left a voicemail to schedule

## 2024-05-30 NOTE — Telephone Encounter (Signed)
 Please contact pt to schedule a follow up visit.

## 2024-06-01 ENCOUNTER — Telehealth: Payer: Self-pay | Admitting: Family

## 2024-06-01 NOTE — Telephone Encounter (Signed)
 Copied from CRM #[id number]. Topic: Medicare AWV >> Jun 01, 2024  2:21 PM Nathanel DEL wrote: Reason for CRM: Called LVM 06/01/2024 to schedule AWV. Please schedule office or virtual visits.  Nathanel Paschal; Care Guide Ambulatory Clinical Support [place] [facility] Direct Dial: [phone]

## 2024-06-24 ENCOUNTER — Other Ambulatory Visit: Payer: Self-pay | Admitting: Family

## 2024-06-24 DIAGNOSIS — I10 Essential (primary) hypertension: Secondary | ICD-10-CM

## 2024-06-24 NOTE — Telephone Encounter (Signed)
 See telephone note from 05/27/24, Pt was left a message on machine and mychart message sent informing he is due for appt. Refill denied.

## 2024-06-30 ENCOUNTER — Encounter: Payer: Self-pay | Admitting: Family

## 2024-07-04 ENCOUNTER — Encounter: Payer: Self-pay | Admitting: Radiology

## 2024-08-10 ENCOUNTER — Ambulatory Visit (INDEPENDENT_AMBULATORY_CARE_PROVIDER_SITE_OTHER): Admitting: Family

## 2024-08-10 DIAGNOSIS — E781 Pure hyperglyceridemia: Secondary | ICD-10-CM | POA: Diagnosis not present

## 2024-08-10 DIAGNOSIS — N401 Enlarged prostate with lower urinary tract symptoms: Secondary | ICD-10-CM

## 2024-08-10 DIAGNOSIS — I10 Essential (primary) hypertension: Secondary | ICD-10-CM | POA: Diagnosis not present

## 2024-08-10 DIAGNOSIS — M069 Rheumatoid arthritis, unspecified: Secondary | ICD-10-CM

## 2024-08-10 DIAGNOSIS — R350 Frequency of micturition: Secondary | ICD-10-CM | POA: Diagnosis not present

## 2024-08-10 LAB — COMPREHENSIVE METABOLIC PANEL WITH GFR
ALT: 30 U/L (ref 0–53)
AST: 22 U/L (ref 0–37)
Albumin: 4.6 g/dL (ref 3.5–5.2)
Alkaline Phosphatase: 91 U/L (ref 39–117)
BUN: 10 mg/dL (ref 6–23)
CO2: 29 meq/L (ref 19–32)
Calcium: 9.5 mg/dL (ref 8.4–10.5)
Chloride: 105 meq/L (ref 96–112)
Creatinine, Ser: 0.87 mg/dL (ref 0.40–1.50)
GFR: 90.22 mL/min (ref >60.00)
Glucose, Bld: 107 mg/dL — ABNORMAL HIGH (ref 70–99)
Potassium: 4.1 meq/L (ref 3.5–5.1)
Sodium: 141 meq/L (ref 135–145)
Total Bilirubin: 0.4 mg/dL (ref 0.2–1.2)
Total Protein: 6.8 g/dL (ref 6.0–8.3)

## 2024-08-10 LAB — LIPID PANEL
Cholesterol: 196 mg/dL (ref <200)
HDL: 38 mg/dL — ABNORMAL LOW (ref >=40)
LDL Cholesterol (Calc): 124 mg/dL — ABNORMAL HIGH
Non-HDL Cholesterol (Calc): 158 mg/dL — ABNORMAL HIGH (ref <130)
Total CHOL/HDL Ratio: 5.2 (calc) — ABNORMAL HIGH (ref <5.0)
Triglycerides: 218 mg/dL — ABNORMAL HIGH (ref <150)

## 2024-08-10 MED ORDER — AMLODIPINE BESYLATE 5 MG PO TABS: ORAL_TABLET | ORAL | 0 refills | Status: AC

## 2024-08-10 MED ORDER — AMLODIPINE BESYLATE 2.5 MG PO TABS: ORAL_TABLET | ORAL | 0 refills | Status: AC

## 2024-08-10 MED ORDER — TADALAFIL 5 MG PO TABS: 5.0000 mg | ORAL_TABLET | Freq: Every day | ORAL | 0 refills | Status: AC

## 2024-08-10 NOTE — Assessment & Plan Note (Signed)
 Reports frequent urination with normal flow but reduced quantity. Previous Flomax  rx was ineffective. Discussed Cialis for BPH symptoms.  Consider urologist referral if no improvement. - Initiated Cialis 5 mg daily for BPH symptoms. - Scheduled follow-up in one month to assess symptom improvement.

## 2024-08-10 NOTE — Patient Instructions (Signed)
°  VISIT SUMMARY: Today, we discussed your blood pressure management and urinary symptoms. You mentioned that you have been taking only one pill a day for your blood pressure medication and have experienced occasional elevated readings. We also talked about your frequent urination and the ineffectiveness of Flomax . You shared that you feel physically better since retiring and have quit smoking and drinking.  YOUR PLAN: -ESSENTIAL HYPERTENSION: Essential hypertension is high blood pressure with no identifiable cause. Your blood pressure was elevated at 143/66 mmHg. We have added Amlodipine  2.5 mg to your current medication to help achieve a target blood pressure under 140 mmHg. Please take this medication as prescribed and monitor your blood pressure at home. We will recheck your blood pressure in one month.  -BENIGN PROSTATIC HYPERPLASIA WITH LOWER URINARY TRACT SYMPTOMS: Benign prostatic hyperplasia (BPH) is an enlarged prostate gland that can cause urinary symptoms. You reported frequent urination with normal flow but reduced quantity, and Flomax  was ineffective. We have started you on Cialis to help with these symptoms. Please take this medication as prescribed and monitor your symptoms. We will assess your improvement in one month, and if there is no improvement, we may refer you to a urologist.  INSTRUCTIONS: Please schedule a follow-up appointment in one month to recheck your blood pressure and assess your urinary symptoms. Continue to monitor your blood pressure at home and take your medications as prescribed.

## 2024-08-10 NOTE — Assessment & Plan Note (Addendum)
 Improvement in his symptoms now that he is retired.  Has declined Rheumatology referral in the past. Continue meloxicam  prn.

## 2024-08-10 NOTE — Assessment & Plan Note (Signed)
°  Blood pressure elevated at 143/66 mmHg. Discussed adding Amlodipine  2.5 mg to achieve target under 140 mmHg. No copay concerns. - Added Amlodipine  2.5 mg to current regimen. - Scheduled follow-up in one month to recheck blood pressure.

## 2024-08-10 NOTE — Progress Notes (Signed)
 Subjective:     Patient ID: William Neal, male    DOB: Apr 29, 1956, 68 y.o.   MRN: [id number]  Chief Complaint  Patient presents with   Hypertension    Here for follow up   Benign Prostatic Hypertrophy    Patient repots symptoms of slow urination and frequency    Hypertension  Benign Prostatic Hypertrophy    Discussed the use of AI scribe software for clinical note transcription with the patient, who gave verbal consent to proceed.  History of Present Illness William Neal is a 68 year old male with hypertension who presents for follow-up on his medication.  He has been taking only one pill a day for his blood pressure medication because cutting the pill in half often results in crushing it. He does not regularly check his blood pressure at home but feels fine and remains active, working in the yard without issues. Occasionally, his wife notices he looks flushed, prompting a check that reveals elevated blood pressure.  He experiences frequent urination, particularly at night, and is seeking alternatives to Flomax , which he took for about six months but found ineffective. He describes his urinary frequency as occurring in spurts, with increased frequency in the mornings and normal flow but smaller quantities. He also experiences urgency, especially when traveling to his daughter's house, which is an hour away.  He recently retired and feels physically better, noting a decrease in knee pain since he no longer climbs ladders or works in extreme temperatures. He remains active by walking regularly. He has a history of smoking and drinking but has quit both, which he feels has improved his health and saved money.  Lab Results  Component Value Date   PSA 0.72 12/04/2023   PSA 0.82 10/17/2022   PSA 0.45 05/14/2020    Lab Results  Component Value Date   CHOL 189 12/04/2023   HDL 29.10 (L) 12/04/2023   LDLCALC 114 (H) 12/04/2023   LDLDIRECT 117.0 05/15/2021   TRIG 229.0 (H)  12/04/2023   CHOLHDL 6 12/04/2023       Health Maintenance Due  Topic Date Due   Medicare Annual Wellness (AWV)  Never done   Colonoscopy  01/06/2024    Past Medical History:  Diagnosis Date   Arthritis    RA and Osteo   BPH (benign prostatic hyperplasia) 10/12/2012   History of hepatitis C    Hypertension     Past Surgical History:  Procedure Laterality Date   COLONOSCOPY     no prior surgery     POLYPECTOMY      Family History  Problem Relation Age of Onset   Diabetes Mother    Emphysema Father    Hypertension Sister    Heart failure Sister        Defibrilltor   Coronary artery disease Brother        multiple stents, 1/2 brother   Hypertension Maternal Uncle    Heart disease Other 80       died in his sleep, ?presumed heart attack   Colon cancer Neg Hx    Esophageal cancer Neg Hx    Rectal cancer Neg Hx    Stomach cancer Neg Hx    Colon polyps Neg Hx     Social History   Socioeconomic History   Marital status: Married    Spouse name: Not on file   Number of children: Not on file   Years of education: Not on file   Highest education level: Associate  degree: occupational, technical, or vocational program  Occupational History   Not on file  Tobacco Use   Smoking status: Former    Current packs/day: 0.00    Average packs/day: 0.5 packs/day for 40.0 years (20.0 ttl pk-yrs)    Types: Cigarettes    Start date: 02/07/1980    Quit date: 09/01/2022    Years since quitting: 1.9   Smokeless tobacco: Never  Vaping Use   Vaping status: Former  Substance and Sexual Activity   Alcohol use: Not Currently    Comment: former heavy alcohol user, 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, recycle program   Married   2 step children   Enjoys fishing   Completed college   Social Drivers of Corporate Investment Banker Strain: Low Risk   (08/09/2024)   Overall Financial Resource Strain (CARDIA)    Difficulty of Paying Living Expenses: Not very hard  Food Insecurity: No Food Insecurity (08/09/2024)   Hunger Vital Sign    Worried About Running Out of Food in the Last Year: Never true    Ran Out of Food in the Last Year: Never true  Transportation Needs: No Transportation Needs (08/09/2024)   PRAPARE - Administrator, Civil Service (Medical): No    Lack of Transportation (Non-Medical): No  Physical Activity: Sufficiently Active (08/09/2024)   Exercise Vital Sign    Days of Exercise per Week: 4 days    Minutes of Exercise per Session: 150+ min  Stress: No Stress Concern Present (08/09/2024)   [employer] of Occupational Health - Occupational Stress Questionnaire    Feeling of Stress: Not at all  Social Connections: Moderately Integrated (08/09/2024)   Social Connection and Isolation Panel    Frequency of Communication with Friends and Family: Twice a week    Frequency of Social Gatherings with Friends and Family: Once a week    Attends Religious Services: More than 4 times per year    Active Member of [employer] or Organizations: No    Attends Engineer, Structural: Not on file    Marital Status: Married  Intimate Partner Violence: Unknown (12/02/2021)   Received from [facility]   HITS    Physically Hurt: Not on file    Insult or Talk Down To: Not on file    Threaten Physical Harm: Not on file    Scream or Curse: Not on file    Outpatient Medications Prior to Visit  Medication Sig Dispense Refill   Ibuprofen (ADVIL PO) Take by mouth as needed.     meloxicam  (MOBIC ) 7.5 MG tablet Take 1 tablet by mouth once daily 90 tablet 0   Multiple Vitamin (MULTI-VITAMIN PO) Take by mouth.     amLODipine  (NORVASC ) 5 MG tablet Take 1.5 tablets (7.5 mg total) by mouth daily. Needs appt 45 tablet 0   No facility-administered medications prior to visit.    No Known Allergies  ROS See HPI    Objective:     Physical Exam Constitutional:      General: He is not in acute distress.    Appearance: He is well-developed.  HENT:     Head: Normocephalic and atraumatic.  Cardiovascular:     Rate and Rhythm: Normal rate and regular rhythm.     Heart sounds: No murmur heard. Pulmonary:  Effort: Pulmonary effort is normal. No respiratory distress.     Breath sounds: Normal breath sounds. No wheezing or rales.  Skin:    General: Skin is warm and dry.  Neurological:     Mental Status: He is alert and oriented to person, place, and time.  Psychiatric:        Behavior: Behavior normal.        Thought Content: Thought content normal.      BP (!) 143/66 (BP Location: Right Arm, Patient Position: Sitting, Cuff Size: Small)   Pulse (!) 49   Temp (!) 97.2 F (36.2 C) (Oral)   Resp 16   Ht 5' 11 (1.803 m)   Wt 211 lb 9.6 oz (96 kg)   SpO2 99%   BMI 29.51 kg/m  Wt Readings from Last 3 Encounters:  08/10/24 211 lb 9.6 oz (96 kg)  12/04/23 205 lb (93 kg)  01/06/23 205 lb (93 kg)       Assessment & Plan:   Problem List Items Addressed This Visit       Unprioritized   Essential hypertension    Blood pressure elevated at 143/66 mmHg. Discussed adding Amlodipine  2.5 mg to achieve target under 140 mmHg. No copay concerns. - Added Amlodipine  2.5 mg to current regimen. - Scheduled follow-up in one month to recheck blood pressure.      Relevant Medications   amLODipine  (NORVASC ) 2.5 MG tablet   amLODipine  (NORVASC ) 5 MG tablet   tadalafil (CIALIS) 5 MG tablet   Other Relevant Orders   Comp Met (CMET)   BPH (benign prostatic hyperplasia) - Primary   Reports frequent urination with normal flow but reduced quantity. Previous Flomax  rx was ineffective. Discussed Cialis for BPH symptoms.  Consider urologist referral if no improvement. - Initiated Cialis 5 mg daily for BPH symptoms. - Scheduled follow-up in one month to assess symptom improvement.      Relevant Medications   tadalafil  (CIALIS) 5 MG tablet   Other Visit Diagnoses       Hypertriglyceridemia       Relevant Medications   amLODipine  (NORVASC ) 2.5 MG tablet   amLODipine  (NORVASC ) 5 MG tablet   tadalafil (CIALIS) 5 MG tablet   Other Relevant Orders   Lipid panel       Assessment & Plan    I have changed William Neal's amLODipine . I am also having him start on amLODipine  and tadalafil. Additionally, I am having him maintain his Multiple Vitamin (MULTI-VITAMIN PO), Ibuprofen (ADVIL PO), and meloxicam .  Meds ordered this encounter  Medications   amLODipine  (NORVASC ) 2.5 MG tablet    Sig: Take one tablet by mouth once daily along with a 5 mg tab for a total of 7.5 mg.    Dispense:  90 tablet    Refill:  0    Supervising Provider:   DOMENICA BLACKBIRD A [[id number]]   amLODipine  (NORVASC ) 5 MG tablet    Sig: Take 1 tablet by mouth once daily along with one 2.5mg  tab for a total of 7.5mg  once daily    Dispense:  90 tablet    Refill:  0    Supervising Provider:   DOMENICA BLACKBIRD A [[id number]]   tadalafil (CIALIS) 5 MG tablet    Sig: Take 1 tablet (5 mg total) by mouth daily.    Dispense:  90 tablet    Refill:  0    Supervising Provider:   DOMENICA BLACKBIRD A [[id number]]

## 2024-08-12 ENCOUNTER — Ambulatory Visit: Payer: Self-pay | Admitting: Family

## 2024-08-12 DIAGNOSIS — E785 Hyperlipidemia, unspecified: Secondary | ICD-10-CM

## 2024-08-12 NOTE — Telephone Encounter (Signed)
 Please advise pt that I reviewed his cholesterol and based on his numbers his risk of heart attack and stroke in the next 10 yrs is  21%.  At this level it is recommended that he start a cholesterol medication to decrease this risk in addition to a healthy low fat diet and exercise.  If he is agreeable, I would like for him to start atorvastatin and repeat lipid panel in 6 weeks.

## 2024-08-15 ENCOUNTER — Telehealth: Payer: Self-pay

## 2024-08-15 MED ORDER — ATORVASTATIN CALCIUM 20 MG PO TABS: 20.0000 mg | ORAL_TABLET | Freq: Every day | ORAL | 1 refills | Status: AC

## 2024-08-15 NOTE — Telephone Encounter (Signed)
 That is fine

## 2024-08-15 NOTE — Telephone Encounter (Signed)
 Copied from CRM [id number]. Topic: Clinical - Lab/Test Results >> Aug 15, 2024 12:43 PM Aisha D wrote: Reason for CRM: Pt is returning a missed call in regards to his lab results. Pt has been made aware of his results and has agreed to taking the atorvastatin . Pt wants to know if he can do the repeat lipid panel at his next appt on 1/30.

## 2024-08-15 NOTE — Telephone Encounter (Signed)
 Patient made aware ok to repeat labs during next appointment

## 2024-08-15 NOTE — Telephone Encounter (Signed)
Patient called back and made aware of results.

## 2024-08-24 ENCOUNTER — Other Ambulatory Visit: Payer: Self-pay | Admitting: Family

## 2024-08-24 DIAGNOSIS — M069 Rheumatoid arthritis, unspecified: Secondary | ICD-10-CM

## 2024-09-30 ENCOUNTER — Ambulatory Visit: Admitting: Family

## 2024-11-04 ENCOUNTER — Ambulatory Visit: Admitting: Family
